# Patient Record
Sex: Male | Born: 1990 | Race: White | Hispanic: No | Marital: Married | Smoking: Current every day smoker
Health system: Southern US, Community
[De-identification: ages and names within clinical notes are randomized; demographics above are authoritative.]

## PROBLEM LIST (undated history)

## (undated) DIAGNOSIS — F988 Other specified behavioral and emotional disorders with onset usually occurring in childhood and adolescence: Secondary | ICD-10-CM

## (undated) DIAGNOSIS — F39 Unspecified mood [affective] disorder: Secondary | ICD-10-CM

## (undated) DIAGNOSIS — S6990XA Unspecified injury of unspecified wrist, hand and finger(s), initial encounter: Secondary | ICD-10-CM

## (undated) DIAGNOSIS — J301 Allergic rhinitis due to pollen: Secondary | ICD-10-CM

## (undated) DIAGNOSIS — R198 Other specified symptoms and signs involving the digestive system and abdomen: Secondary | ICD-10-CM

## (undated) DIAGNOSIS — G4700 Insomnia, unspecified: Secondary | ICD-10-CM

## (undated) DIAGNOSIS — F329 Major depressive disorder, single episode, unspecified: Secondary | ICD-10-CM

## (undated) DIAGNOSIS — S82409A Unspecified fracture of shaft of unspecified fibula, initial encounter for closed fracture: Secondary | ICD-10-CM

## (undated) DIAGNOSIS — F419 Anxiety disorder, unspecified: Secondary | ICD-10-CM

## (undated) DIAGNOSIS — F909 Attention-deficit hyperactivity disorder, unspecified type: Secondary | ICD-10-CM

## (undated) DIAGNOSIS — R030 Elevated blood-pressure reading, without diagnosis of hypertension: Secondary | ICD-10-CM

## (undated) DIAGNOSIS — F32A Depression, unspecified: Secondary | ICD-10-CM

## (undated) DIAGNOSIS — G43909 Migraine, unspecified, not intractable, without status migrainosus: Secondary | ICD-10-CM

## (undated) HISTORY — DX: Depression, unspecified: F32.A

## (undated) HISTORY — DX: Major depressive disorder, single episode, unspecified: F32.9

## (undated) HISTORY — DX: Elevated blood-pressure reading, without diagnosis of hypertension: R03.0

## (undated) HISTORY — DX: Unspecified mood (affective) disorder: F39

## (undated) HISTORY — DX: Migraine, unspecified, not intractable, without status migrainosus: G43.909

## (undated) HISTORY — DX: Other specified behavioral and emotional disorders with onset usually occurring in childhood and adolescence: F98.8

## (undated) HISTORY — DX: Anxiety disorder, unspecified: F41.9

## (undated) HISTORY — PX: INGUINAL HERNIA REPAIR: SHX194

## (undated) HISTORY — DX: Allergic rhinitis due to pollen: J30.1

## (undated) HISTORY — DX: Insomnia, unspecified: G47.00

## (undated) HISTORY — DX: Unspecified injury of unspecified wrist, hand and finger(s), initial encounter: S69.90XA

## (undated) HISTORY — DX: Attention-deficit hyperactivity disorder, unspecified type: F90.9

---
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
## 2011-05-15 ENCOUNTER — Emergency Department (HOSPITAL_BASED_OUTPATIENT_CLINIC_OR_DEPARTMENT_OTHER)
Admission: EM | Admit: 2011-05-15 | Discharge: 2011-05-15 | Disposition: A | Discharge status: Home / Self Care | Payer: Self-pay | Attending: Emergency Medicine | Admitting: Emergency Medicine

## 2011-05-15 ENCOUNTER — Encounter (HOSPITAL_BASED_OUTPATIENT_CLINIC_OR_DEPARTMENT_OTHER): Payer: Self-pay | Admitting: Emergency Medicine

## 2011-05-15 DIAGNOSIS — F17200 Nicotine dependence, unspecified, uncomplicated: Secondary | ICD-10-CM | POA: Insufficient documentation

## 2011-05-15 DIAGNOSIS — B372 Candidiasis of skin and nail: Secondary | ICD-10-CM | POA: Insufficient documentation

## 2011-05-15 MED ORDER — CLOTRIMAZOLE 1 % EX CREA: TOPICAL_CREAM | CUTANEOUS | Status: AC

## 2011-05-15 NOTE — ED Provider Notes (Signed)
History     CSN: [id number]  Arrival date & time 05/15/11  1704   First MD Initiated Contact with Patient 05/15/11 1706      Chief Complaint  Patient presents with  . Penile Discharge    HPI Patient presents with concerns of lesions on his penis.  He notes that his symptoms began approximately one month ago.  Since that time there have been persistent skin lesions at the proximal edge of the glans.  He denies any new dysuria, polyuria, fevers, chills, nausea, vomiting, other focal complaints.  The patient is married and monogamous.  He uses condoms for intercourse.  He denies any history of STDs.   History reviewed. No pertinent past medical history.  History reviewed. No pertinent past surgical history.  No family history on file.  History  Substance Use Topics  . Smoking status: Current Everyday Smoker -- 1.0 packs/day  . Smokeless tobacco: Not on file  . Alcohol Use: No      Review of Systems  All other systems reviewed and are negative.    Allergies  Ceclor  Home Medications   Current Outpatient Rx  Name Route Sig Dispense Refill  . CLOTRIMAZOLE 1 % EX CREA  Apply to affected area 2 times daily for two weeks. 15 g 0    BP 150/71  Pulse 92  Temp(Src) 98.1 F (36.7 C) (Oral)  Resp 16  Ht 5\' 10"  (1.778 m)  Wt 189 lb (85.73 kg)  BMI 27.12 kg/m2  SpO2 100%  Physical Exam  Nursing note and vitals reviewed. Constitutional: He appears well-developed and well-nourished. No distress.  HENT:  Head: Normocephalic and atraumatic.  Eyes: Conjunctivae and EOM are normal.  Cardiovascular: Normal rate and regular rhythm.   Pulmonary/Chest: Effort normal and breath sounds normal.  Abdominal: Hernia confirmed negative in the right inguinal area and confirmed negative in the left inguinal area.  Genitourinary: Testes normal.    Right testis shows no mass and no swelling. Right testis is descended. Left testis shows no mass and no swelling. Left testis is descended.  Circumcised. No phimosis, hypospadias, penile erythema or penile tenderness. No discharge found.  Lymphadenopathy:       Right: No inguinal adenopathy present.       Left: No inguinal adenopathy present.  Skin: He is not diaphoretic.    ED Course  Procedures (including critical care time)  Labs Reviewed - No data to display No results found.   1. Yeast infection of the skin       MDM  This generally well-appearing young male, who is married, monogamous, with no history of STDs now presents with approximately one month of penis sores.  On exam the patient is in no distress, with unremarkable vital signs.  The patient has cutaneous lesions consistent with yeast infection.  I discussed this at length with the patient and his wife.  The patient was discharged with instructions for topical medication, PMD followup.    Gerhard Munch, MD 05/15/11 1734

## 2011-05-15 NOTE — ED Notes (Signed)
States he has a large sore that looks similar to the ulcers in your mouth on his penis.  Is not sure how long he has had it but thinks it's been almost a month.

## 2011-05-15 NOTE — Discharge Instructions (Signed)
Cutaneous Candidiasis  Cutaneous candidiasis is a condition in which there is an overgrowth of yeast (candida) on the skin. Yeast normally live on the skin, but in small enough numbers not to cause any symptoms. In certain cases, increased growth of the yeast may cause an actual yeast infection. This kind of infection usually occurs in areas of the skin that are constantly warm and moist, such as the armpits or the groin. Yeast is the most common cause of diaper rash in babies and in people who cannot control their bowel movements (incontinence).  CAUSES   The fungus that most often causes cutaneous candidiasis is Candida albicans. Conditions that can increase the risk of getting a yeast infection of the skin include:   Obesity.   Pregnancy.   Diabetes.   Taking antibiotic medicine.   Taking birth control pills.   Taking steroid medicines.   Thyroid disease.   An iron or zinc deficiency.   Problems with the immune system.  SYMPTOMS    Red, swollen area of the skin.   Bumps on the skin.   Itchiness.  DIAGNOSIS   The diagnosis of cutaneous candidiasis is usually based on its appearance. Light scrapings of the skin may also be taken and viewed under a microscope to identify the presence of yeast.  TREATMENT   Antifungal creams may be applied to the infected skin. In severe cases, oral medicines may be needed.   HOME CARE INSTRUCTIONS    Keep your skin clean and dry.   Maintain a healthy weight.   If you have diabetes, keep your blood sugar under control.  SEEK IMMEDIATE MEDICAL CARE IF:   Your rash continues to spread despite treatment.   You have a fever, chills, or abdominal pain.  Document Released: 09/17/2010 Document Revised: 12/19/2010 Document Reviewed: 09/17/2010  [employer] Patient Information 2012 [employer].

## 2012-03-07 ENCOUNTER — Emergency Department (HOSPITAL_BASED_OUTPATIENT_CLINIC_OR_DEPARTMENT_OTHER)
Admission: EM | Admit: 2012-03-07 | Discharge: 2012-03-08 | Disposition: A | Discharge status: Home / Self Care | Payer: PRIVATE HEALTH INSURANCE | Attending: Emergency Medicine | Admitting: Emergency Medicine

## 2012-03-07 ENCOUNTER — Encounter (HOSPITAL_BASED_OUTPATIENT_CLINIC_OR_DEPARTMENT_OTHER): Payer: Self-pay | Admitting: *Deleted

## 2012-03-07 DIAGNOSIS — K6289 Other specified diseases of anus and rectum: Secondary | ICD-10-CM | POA: Insufficient documentation

## 2012-03-07 DIAGNOSIS — L539 Erythematous condition, unspecified: Secondary | ICD-10-CM | POA: Insufficient documentation

## 2012-03-07 DIAGNOSIS — L0291 Cutaneous abscess, unspecified: Secondary | ICD-10-CM

## 2012-03-07 DIAGNOSIS — L0231 Cutaneous abscess of buttock: Secondary | ICD-10-CM | POA: Insufficient documentation

## 2012-03-07 DIAGNOSIS — F17200 Nicotine dependence, unspecified, uncomplicated: Secondary | ICD-10-CM | POA: Insufficient documentation

## 2012-03-07 MED ORDER — OXYCODONE-ACETAMINOPHEN 5-325 MG PO TABS: 2.0000 | ORAL_TABLET | ORAL | Status: DC | PRN

## 2012-03-07 MED ORDER — ONDANSETRON 8 MG PO TBDP
8.0000 mg | ORAL_TABLET | Freq: Once | ORAL | Status: AC
  Administered 2012-03-07: 8 mg via ORAL
  Filled 2012-03-07: qty 1

## 2012-03-07 MED ORDER — MORPHINE SULFATE 4 MG/ML IJ SOLN
4.0000 mg | Freq: Once | INTRAMUSCULAR | Status: AC
  Administered 2012-03-07: 4 mg via INTRAMUSCULAR
  Filled 2012-03-07: qty 1

## 2012-03-07 MED ORDER — OXYCODONE-ACETAMINOPHEN 5-325 MG PO TABS
2.0000 | ORAL_TABLET | Freq: Once | ORAL | Status: AC
  Administered 2012-03-07: 2 via ORAL
  Filled 2012-03-07 (×2): qty 2

## 2012-03-07 MED ORDER — SULFAMETHOXAZOLE-TRIMETHOPRIM 800-160 MG PO TABS: 1.0000 | ORAL_TABLET | Freq: Two times a day (BID) | ORAL | Status: AC

## 2012-03-07 NOTE — ED Provider Notes (Signed)
History     CSN: [id number]  Arrival date & time 03/07/12  2130   First MD Initiated Contact with Patient 03/07/12 2218      Chief Complaint  Patient presents with  . boil to rectal area     (Consider location/radiation/quality/duration/timing/severity/associated sxs/prior treatment) HPI Comments: Patient is a 22 year old male who presents with a 1 week history of a "boil" near his anal area. Patient reports a gradual onset and progressive worsening of symptoms. Patient reports associated throbbing, severe pain that does not radiate. Patient also reports swelling and redness to the localized area. Palpation of the area makes the pain worse. No alleviating factors. Patient has not tried anything for symptoms. Patient denies fever, open wound, NVD, drainage from the affected area.    History reviewed. No pertinent past medical history.  History reviewed. No pertinent past surgical history.  No family history on file.  History  Substance Use Topics  . Smoking status: Current Every Day Smoker -- 1.00 packs/day  . Smokeless tobacco: Not on file  . Alcohol Use: No      Review of Systems  Skin: Positive for wound.  All other systems reviewed and are negative.    Allergies  Ceclor  Home Medications   Current Outpatient Rx  Name  Route  Sig  Dispense  Refill  . clotrimazole (LOTRIMIN) 1 % cream      Apply to affected area 2 times daily for two weeks.   15 g   0   . Neomycin-Bacitracin-Polymyxin (NEOSPORIN EX)   Apply externally   Apply 1 application topically daily. Patient used this medication for an injury to hand.           BP 123/72  Pulse 117  Temp(Src) 99.2 F (37.3 C) (Oral)  Resp 18  Ht 5\' 11"  (1.803 m)  Wt 200 lb 8 oz (90.946 kg)  BMI 27.98 kg/m2  SpO2 98%  Physical Exam  Nursing note and vitals reviewed. Constitutional: He is oriented to person, place, and time. He appears well-developed and well-nourished. No distress.  HENT:  Head:  Normocephalic and atraumatic.  Eyes: Conjunctivae and EOM are normal.  Neck: Normal range of motion. Neck supple.  Cardiovascular: Normal rate and regular rhythm.  Exam reveals no gallop and no friction rub.   No murmur heard. Pulmonary/Chest: Effort normal and breath sounds normal. He has no wheezes. He has no rales. He exhibits no tenderness.  Abdominal: Soft. He exhibits no distension. There is no tenderness. There is no rebound.  Genitourinary:  6cmx4cm area of induration with central fluctuance of left buttock. Affected area is erythematous.   Musculoskeletal: Normal range of motion.  Neurological: He is alert and oriented to person, place, and time. Coordination normal.  Speech is goal-oriented. Moves limbs without ataxia.   Skin: Skin is warm and dry.  See genitourinary.   Psychiatric: He has a normal mood and affect. His behavior is normal.    ED Course  Procedures (including critical care time)  INCISION AND DRAINAGE Performed by: Emilia Beck Consent: Verbal consent obtained. Risks and benefits: risks, benefits and alternatives were discussed Type: abscess  Body area: left buttock  Anesthesia: local infiltration  Incision was made with a scalpel.  Local anesthetic: lidocaine 2% with epinephrine  Anesthetic total: 2 ml  Complexity: complex Blunt dissection to break up loculations  Drainage: purulent  Drainage amount: 20 mL  Packing material: none  Patient tolerance: Patient tolerated the procedure well with no immediate complications.  Labs Reviewed - No data to display No results found.   1. Abscess and cellulitis       MDM  11:33 PM Abscess drained without complication. Patient will be discharged with Bactrim and pain medication. Patient instructed to return with worsening or concerning symptoms.         Emilia Beck, [place] 03/15/12 2041

## 2012-03-07 NOTE — ED Notes (Signed)
MD at bedside. 

## 2012-03-07 NOTE — ED Notes (Signed)
C/o "boil" near anal area that started one week ago. Denies hx of same. Denies fever. Denies any drainage from wound.

## 2012-03-07 NOTE — ED Notes (Signed)
Kaitlyn, PAC at bedside.  

## 2012-03-16 NOTE — ED Provider Notes (Signed)
History/physical exam/procedure(s) were performed by non-physician practitioner and as supervising physician I was immediately available for consultation/collaboration. I have reviewed all notes and am in agreement with care and plan. I saw and evaluated this 22 y.o. Male with and abscess to the left buttock.  The area has been tender and increasingly painful for several days.   Hilario Quarry, MD 03/16/12 [phone]

## 2012-06-06 ENCOUNTER — Encounter (HOSPITAL_BASED_OUTPATIENT_CLINIC_OR_DEPARTMENT_OTHER): Payer: Self-pay

## 2012-06-06 ENCOUNTER — Emergency Department (HOSPITAL_BASED_OUTPATIENT_CLINIC_OR_DEPARTMENT_OTHER)
Admission: EM | Admit: 2012-06-06 | Discharge: 2012-06-07 | Disposition: A | Discharge status: Home / Self Care | Payer: Self-pay | Attending: Emergency Medicine | Admitting: Emergency Medicine

## 2012-06-06 DIAGNOSIS — Y9389 Activity, other specified: Secondary | ICD-10-CM | POA: Insufficient documentation

## 2012-06-06 DIAGNOSIS — Y929 Unspecified place or not applicable: Secondary | ICD-10-CM | POA: Insufficient documentation

## 2012-06-06 DIAGNOSIS — Z9189 Other specified personal risk factors, not elsewhere classified: Secondary | ICD-10-CM

## 2012-06-06 DIAGNOSIS — Z23 Encounter for immunization: Secondary | ICD-10-CM | POA: Insufficient documentation

## 2012-06-06 DIAGNOSIS — M795 Residual foreign body in soft tissue: Secondary | ICD-10-CM | POA: Insufficient documentation

## 2012-06-06 DIAGNOSIS — F17200 Nicotine dependence, unspecified, uncomplicated: Secondary | ICD-10-CM | POA: Insufficient documentation

## 2012-06-06 NOTE — ED Notes (Signed)
Patient here for evaluation of tick removal. Tick was embedded in right leg and he thinks part of same still there. Reports that he doesn't feel right

## 2012-06-07 MED ORDER — DOXYCYCLINE HYCLATE 100 MG PO CAPS: 100.0000 mg | ORAL_CAPSULE | Freq: Two times a day (BID) | ORAL | Status: DC

## 2012-06-07 MED ORDER — IBUPROFEN 200 MG PO TABS
ORAL_TABLET | ORAL | Status: AC
  Filled 2012-06-07: qty 4

## 2012-06-07 MED ORDER — IBUPROFEN 800 MG PO TABS
800.0000 mg | ORAL_TABLET | Freq: Once | ORAL | Status: AC
  Administered 2012-06-07: 800 mg via ORAL
  Filled 2012-06-07: qty 1

## 2012-06-07 MED ORDER — IBUPROFEN 600 MG PO TABS: 600.0000 mg | ORAL_TABLET | Freq: Four times a day (QID) | ORAL | Status: DC | PRN

## 2012-06-07 MED ORDER — TETANUS-DIPHTH-ACELL PERTUSSIS 5-2.5-18.5 LF-MCG/0.5 IM SUSP
0.5000 mL | Freq: Once | INTRAMUSCULAR | Status: AC
  Administered 2012-06-07: 0.5 mL via INTRAMUSCULAR
  Filled 2012-06-07: qty 0.5

## 2012-06-07 MED ORDER — DOXYCYCLINE HYCLATE 100 MG PO TABS
100.0000 mg | ORAL_TABLET | Freq: Once | ORAL | Status: AC
  Administered 2012-06-07: 100 mg via ORAL
  Filled 2012-06-07: qty 1

## 2012-06-07 NOTE — ED Notes (Signed)
D/c home with rx x 2 for doxycycline and ibuprofen

## 2012-06-07 NOTE — ED Provider Notes (Signed)
History  This chart was scribed for Devin Frick Smitty Cords, MD by Devin Zimmerman, ED Scribe. This patient was seen in room MH07/MH07 and the patient's care was started at 12:25 AM.  CSN: [id number]  Arrival date & time 06/06/12  2211    Chief Complaint  Patient presents with  . Tick Removal    Patient is a 22 y.o. male presenting with rash. The history is provided by the patient. No language interpreter was used.  Rash Pain location: right lateral thigh. Pain severity:  No pain Context: not sick contacts   Context comment:  2 pimples and found a tick embedded in the more anterior one Relieved by:  Nothing Worsened by:  Nothing tried Ineffective treatments:  None tried Associated symptoms: no chest pain, no cough, no diarrhea, no dysuria, no fever, no nausea, no shortness of breath, no sore throat and no vomiting   Risk factors: no recent hospitalization     HPI Comments: Devin Zimmerman is a 22 y.o. male who presents to the Emergency Department for evaluation of tick removal. Pt states the tick was embedded in his upper, right leg and he thinks some of it is still in there. Pt thinks the tick was on his leg all night. Pt denies fever, neck pain, sore throat, visual disturbance, CP, cough, SOB, abdominal pain, nausea, emesis, diarrhea, urinary symptoms, back pain, HA, weakness, numbness and rash as associated symptoms.    History reviewed. No pertinent past medical history.  History reviewed. No pertinent past surgical history.  No family history on file.  History  Substance Use Topics  . Smoking status: Current Every Day Smoker -- 1.00 packs/day  . Smokeless tobacco: Not on file  . Alcohol Use: No      Review of Systems  Constitutional: Negative for fever.  HENT: Negative for sore throat, neck pain and neck stiffness.   Eyes: Negative for photophobia and visual disturbance.  Respiratory: Negative for cough and shortness of breath.   Cardiovascular: Negative for chest  pain.  Gastrointestinal: Negative for nausea, vomiting, abdominal pain and diarrhea.  Genitourinary: Negative for dysuria and difficulty urinating.  Musculoskeletal: Negative for back pain.  Skin: Negative for rash.  Neurological: Negative for weakness and headaches.  All other systems reviewed and are negative.    Allergies  Ceclor  Home Medications  No current outpatient prescriptions on file.  BP 145/71  Pulse 77  Temp(Src) 98.8 F (37.1 C) (Oral)  Resp 20  SpO2 100%  Physical Exam  Nursing note and vitals reviewed. Constitutional: He appears well-developed and well-nourished.  HENT:  Head: Normocephalic and atraumatic.  Mouth/Throat: Oropharynx is clear and moist.  Eyes: EOM are normal. Pupils are equal, round, and reactive to light.  Neck: Normal range of motion. Neck supple.  No meningial signs. No lymphanopothy.   Cardiovascular: Normal rate and regular rhythm.   Pulmonary/Chest: Effort normal and breath sounds normal.  Abdominal: Soft. Bowel sounds are normal. There is no tenderness.  Musculoskeletal: Normal range of motion.  No erythema. No ringlike.    Skin: Skin is warm and dry. No rash noted.  No bullseye rash. No rash on wrists or ankles. 2 pimples 6 mm right lateral proximal thigh    ED Course  FOREIGN BODY REMOVAL Date/Time: 06/07/2012 12:44 AM Performed by: Jasmine Awe Authorized by: Jasmine Awe Consent: Verbal consent obtained. Patient identity confirmed: arm band Intake: right lateral proximal thigh. Patient sedated: no Patient restrained: no Complexity: complex 1 objects recovered. Objects  recovered: pincer from tick Post-procedure assessment: foreign body removed Patient tolerance: Patient tolerated the procedure well with no immediate complications.   (including critical care time)  DIAGNOSTIC STUDIES: Oxygen Saturation is 100% on RA, normal by my interpretation.    COORDINATION OF CARE: 12:38 AM-Discussed  treatment plan with pt at bedside and pt agreed to plan.   Labs Reviewed - No data to display No results found.   No diagnosis found.    MDM  Will start doxycycline 3 week course as patient thinks it was only there for 24 hours but is not sure.  Follow up on Tuesday with your doctor for ongoing testing and evaluation.  Patient verbalizes understanding and agrees to followup    I personally performed the services described in this documentation, which was scribed in my presence. The recorded information has been reviewed and is accurate.    Jasmine Awe, MD 06/07/12 [phone]

## 2013-03-29 ENCOUNTER — Encounter: Payer: Self-pay | Admitting: Family Medicine

## 2013-03-29 ENCOUNTER — Ambulatory Visit (INDEPENDENT_AMBULATORY_CARE_PROVIDER_SITE_OTHER): Payer: BC Managed Care – PPO | Admitting: Family Medicine

## 2013-03-29 VITALS — BP 148/84 | HR 93 | Temp 99.1°F | Resp 18 | Ht 70.0 in | Wt 208.0 lb

## 2013-03-29 DIAGNOSIS — R112 Nausea with vomiting, unspecified: Secondary | ICD-10-CM

## 2013-03-29 DIAGNOSIS — J019 Acute sinusitis, unspecified: Secondary | ICD-10-CM

## 2013-03-29 DIAGNOSIS — J209 Acute bronchitis, unspecified: Secondary | ICD-10-CM

## 2013-03-29 MED ORDER — AZITHROMYCIN 250 MG PO TABS: ORAL_TABLET | ORAL | Status: DC

## 2013-03-29 MED ORDER — PREDNISONE 20 MG PO TABS: ORAL_TABLET | ORAL | Status: DC

## 2013-03-29 MED ORDER — HYDROCODONE-HOMATROPINE 5-1.5 MG/5ML PO SYRP: ORAL_SOLUTION | ORAL | Status: DC

## 2013-03-29 NOTE — Progress Notes (Signed)
Pre visit review using our clinic review tool, if applicable. No additional management support is needed unless otherwise documented below in the visit note. 

## 2013-03-29 NOTE — Progress Notes (Signed)
Office Note 03/29/2013  CC:  Chief Complaint  Patient presents with  . Emesis    this am  . Cough    x 1 week    HPI:  Devin Zimmerman is a 23 y.o. White male who is here to establish care and discuss acute issue: vomiting this morning. Patient's most recent primary MD: none Old records in EPIC/[facility] EMR were reviewed prior to or during today's visit.  Onset of nasal congest/runny nose, cough (worst symptom), subj f/c but Tm when temp checked has been 99. No wheezing or SOB.  Cough not worsened by anything, no improvement with OTC meds like robitussin. This morning he ate a biscuit and drank sweet tea and he vomited all of this up.  Still feeling nauseated currently. No abd pain.  No loose BMs.  No ST.  HA "sometimes".  History reviewed. No pertinent past medical history.  History reviewed. No pertinent past surgical history.  History reviewed. No pertinent family history.  History   Social History  . Marital Status: Married    Spouse Name: N/A    Number of Children: N/A  . Years of Education: N/A   Occupational History  . Not on file.   Social History Main Topics  . Smoking status: Former Smoker -- 1.00 packs/day    Types: Cigarettes  . Smokeless tobacco: Never Used  . Alcohol Use: No  . Drug Use: No  . Sexual Activity: Yes   Other Topics Concern  . Not on file   Social History Narrative   Married, 2 children (ages 633 and 1).   Occupation: Engineer, siteHVAC technician.   Education: finished 10th grade   No tobacco, occ alcohol, no hx of ETOH abuse or drug use.         MEDS: robitussin  Allergies  Allergen Reactions  . Ceclor [Cefaclor] Rash    ROS Review of Systems  Constitutional: Positive for fatigue. Negative for fever.  HENT: Negative for congestion and sore throat.   Eyes: Negative for visual disturbance.  Respiratory: Positive for cough.   Cardiovascular: Negative for chest pain.  Gastrointestinal: Negative for nausea and abdominal pain.   Genitourinary: Negative for dysuria.  Musculoskeletal: Negative for back pain and joint swelling.  Skin: Negative for rash.  Neurological: Negative for weakness and headaches.  Hematological: Negative for adenopathy.    PE; Blood pressure 148/84, pulse 93, temperature 99.1 F (37.3 C), temperature source Temporal, resp. rate 18, height 5\' 10"  (1.778 m), weight 208 lb (94.348 kg), SpO2 98.00%. VS: noted--normal. Gen: alert, NAD, tired but nontoxic-APPEARING. HEENT: eyes without injection, drainage, or swelling.  Ears: EACs clear, TMs with normal light reflex and landmarks.  Nose: Clear rhinorrhea, with some dried, crusty exudate adherent to mildly injected mucosa.  No purulent d/c.  No paranasal sinus TTP.  No facial swelling.  Throat and mouth without focal lesion.  No pharyngial swelling, erythema, or exudate.   Neck: supple, no LAD.   LUNGS: CTA bilat, nonlabored resps.   CV: RRR, no m/r/g. ABD: soft, NT/ND, BS slightly hypoactive.  No mass or HSM or bruit. EXT: no c/c/e SKIN: no rash  Pertinent labs:  none  ASSESSMENT AND PLAN:   New pt; no old records to obtain.  Prolonged URI, acute bronchitis. Nausea likely related to excessive mucous/PND. Plan: Z-pack, prednisone 40mg  qd x 5d, hycodan susp 1-2 tsp q6h prn, #240 ml, no RF. Signs/symptoms to call or return for were reviewed and pt expressed understanding.  An After Visit Summary  was printed and given to the patient.  F/u prn.

## 2015-01-22 ENCOUNTER — Encounter: Payer: Self-pay | Admitting: Family Medicine

## 2015-01-22 ENCOUNTER — Ambulatory Visit (INDEPENDENT_AMBULATORY_CARE_PROVIDER_SITE_OTHER): Payer: BLUE CROSS/BLUE SHIELD | Admitting: Family Medicine

## 2015-01-22 ENCOUNTER — Encounter: Payer: Self-pay | Admitting: *Deleted

## 2015-01-22 VITALS — BP 134/84 | HR 103 | Temp 98.2°F | Resp 16 | Ht 70.0 in | Wt 203.8 lb

## 2015-01-22 DIAGNOSIS — J069 Acute upper respiratory infection, unspecified: Secondary | ICD-10-CM | POA: Diagnosis not present

## 2015-01-22 DIAGNOSIS — B9789 Other viral agents as the cause of diseases classified elsewhere: Principal | ICD-10-CM

## 2015-01-22 MED ORDER — HYDROCODONE-HOMATROPINE 5-1.5 MG/5ML PO SYRP: ORAL_SOLUTION | ORAL | Status: DC

## 2015-01-22 NOTE — Patient Instructions (Signed)
Get otc generic robitussin DM OR Mucinex DM and use as directed on the packaging for cough and congestion. Use otc generic saline nasal spray 2-3 times per day to irrigate/moisturize your nasal passages.   

## 2015-01-22 NOTE — Progress Notes (Signed)
Pre visit review using our clinic review tool, if applicable. No additional management support is needed unless otherwise documented below in the visit note. 

## 2015-01-22 NOTE — Progress Notes (Signed)
OFFICE NOTE  01/22/2015  CC:  Chief Complaint  Patient presents with  . Cough    x 3 days    HPI: Patient is a 25 y.o. Caucasian male who is here for cough. Onset 4 days ago runny nose, cough, feels tight in chest when he coughs.  No ST or HA.  No fever. No body aches.  No OTC meds tried for symptoms except cough drops. He is not a smoker.  Has not had flu vaccine this season.  Pertinent PMH:  Past medical, surgical, social, and family history reviewed and no changes are noted since last office visit.  MEDS:  MVI,  PE: Blood pressure 134/84, pulse 103, temperature 98.2 F (36.8 C), temperature source Oral, resp. rate 16, height 5\' 10"  (1.778 m), weight 203 lb 12 oz (92.42 kg), SpO2 96 %. VS: noted--normal. Gen: alert, NAD, NONTOXIC APPEARING. HEENT: eyes without injection, drainage, or swelling.  Ears: EACs clear, TMs with normal light reflex and landmarks.  Nose: Clear rhinorrhea, with some dried, crusty exudate adherent to mildly injected mucosa.  No purulent d/c.  Minimal L>R paranasal sinus TTP.  No facial swelling.  Throat and mouth without focal lesion.  No pharyngial swelling, erythema, or exudate.   Neck: supple, no LAD.   LUNGS: CTA bilat, nonlabored resps.   CV: RRR, no m/r/g. EXT: no c/c/e SKIN: no rash   IMPRESSION AND PLAN:  Viral URI with cough. Get otc generic robitussin DM OR Mucinex DM and use as directed on the packaging for cough and congestion. Use otc generic saline nasal spray 2-3 times per day to irrigate/moisturize your nasal passages. Hycodan syrup, 1-2 tsp qhs prn, #12120ml.  Therapeutic expectations and side effect profile of medication discussed today.  Patient's questions answered.  Push fluids, rest. Work note for today.  An After Visit Summary was printed and given to the patient.  FOLLOW UP: prn

## 2015-04-11 ENCOUNTER — Ambulatory Visit (INDEPENDENT_AMBULATORY_CARE_PROVIDER_SITE_OTHER): Payer: BLUE CROSS/BLUE SHIELD | Admitting: Family Medicine

## 2015-04-11 ENCOUNTER — Encounter: Payer: Self-pay | Admitting: Family Medicine

## 2015-04-11 VITALS — BP 118/81 | HR 86 | Temp 98.8°F | Resp 16 | Ht 70.0 in | Wt 207.5 lb

## 2015-04-11 DIAGNOSIS — B9789 Other viral agents as the cause of diseases classified elsewhere: Principal | ICD-10-CM

## 2015-04-11 DIAGNOSIS — J069 Acute upper respiratory infection, unspecified: Secondary | ICD-10-CM

## 2015-04-11 MED ORDER — HYDROCODONE-HOMATROPINE 5-1.5 MG/5ML PO SYRP: 5.0000 mL | ORAL_SOLUTION | Freq: Three times a day (TID) | ORAL | Status: DC | PRN

## 2015-04-11 NOTE — Progress Notes (Signed)
OFFICE VISIT  04/11/2015   CC:  Chief Complaint  Patient presents with  . Cough    x 3 days  . Headache    x 3 days   HPI:    Patient is a 25 y.o. Caucasian male who presents for cough and headache. Onset 3-4 days ago, cough, sniffles, HA, mild chest tightness but no wheezing.  No fever. No pain in face or upper teeth.  No known sick contacts.  Fatigued but no achiness in body. Tylenol, aleve, ibup all tried an no help for HA.   No ST.  The cough is nonproductive for the most part. Tried robitussin--no help x 1 night.  No past medical history on file. No signif PMH  No past surgical history on file. No PSH  MEDS: no rx meds.  Allergies  Allergen Reactions  . Ceclor [Cefaclor] Rash    ROS As per HPI  PE: Blood pressure 118/81, pulse 86, temperature 98.8 F (37.1 C), temperature source Oral, resp. rate 16, height 5\' 10"  (1.778 m), weight 207 lb 8 oz (94.121 kg), SpO2 98 %. VS: noted--normal. Gen: alert, NAD, NONTOXIC APPEARING. HEENT: eyes without injection, drainage, or swelling.  Ears: EACs clear, TMs with normal light reflex and landmarks.  Nose: Clear rhinorrhea, with some dried, crusty exudate adherent to mildly injected mucosa.  No purulent d/c.  No paranasal sinus TTP.  No facial swelling.  Throat and mouth without focal lesion.  No pharyngial swelling, erythema, or exudate.   Neck: supple, no LAD.   LUNGS: CTA bilat, nonlabored resps.   CV: RRR, no m/r/g. EXT: no c/c/e SKIN: no rash  LABS:  none  IMPRESSION AND PLAN:  URI with cough.  NO sign of bacterial infection or RAD. Treat symptomatically with hycodan cough syrup, 1 tsp q6h prn, #26940ml. Push fluids.  Rest.  An After Visit Summary was printed and given to the patient.   FOLLOW UP: Return if symptoms worsen or fail to improve.  Signed:  Santiago BumpersPhil McGowen, MD           04/11/2015

## 2015-04-11 NOTE — Progress Notes (Signed)
Pre visit review using our clinic review tool, if applicable. No additional management support is needed unless otherwise documented below in the visit note. 

## 2015-05-21 ENCOUNTER — Ambulatory Visit (INDEPENDENT_AMBULATORY_CARE_PROVIDER_SITE_OTHER): Payer: BLUE CROSS/BLUE SHIELD | Admitting: Family Medicine

## 2015-05-21 ENCOUNTER — Ambulatory Visit (HOSPITAL_BASED_OUTPATIENT_CLINIC_OR_DEPARTMENT_OTHER)
Admission: RE | Admit: 2015-05-21 | Discharge: 2015-05-21 | Disposition: A | Discharge status: Home / Self Care | Payer: BLUE CROSS/BLUE SHIELD | Source: Ambulatory Visit | Attending: Family Medicine | Admitting: Family Medicine

## 2015-05-21 ENCOUNTER — Encounter: Payer: Self-pay | Admitting: Family Medicine

## 2015-05-21 VITALS — BP 130/84 | HR 75 | Temp 98.8°F | Resp 18 | Wt 206.8 lb

## 2015-05-21 DIAGNOSIS — S6991XA Unspecified injury of right wrist, hand and finger(s), initial encounter: Secondary | ICD-10-CM | POA: Insufficient documentation

## 2015-05-21 DIAGNOSIS — X58XXXA Exposure to other specified factors, initial encounter: Secondary | ICD-10-CM | POA: Insufficient documentation

## 2015-05-21 MED ORDER — NAPROXEN 500 MG PO TABS: 500.0000 mg | ORAL_TABLET | Freq: Two times a day (BID) | ORAL | Status: DC
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
## 2015-05-21 NOTE — Patient Instructions (Signed)
Xray placed for you to get tonight.  Take naproxen two x a day for 10 days (with food).  Wear your wrist splint during waking hours.  Avoid twisting wrist.  F/U 1 week  Wrist Pain There are many things that can cause wrist pain. Some common causes include:  An injury to the wrist area, such as a sprain, strain, or fracture.  Overuse of the joint.  A condition that causes increased pressure on a nerve in the wrist (carpal tunnel syndrome).  Wear and tear of the joints that occurs with aging (osteoarthritis).  A variety of other types of arthritis. Sometimes, the cause of wrist pain is not known. The pain often goes away when you follow your health care provider's instructions for relieving pain at home. If your wrist pain continues, tests may need to be done to diagnose your condition. HOME CARE INSTRUCTIONS Pay attention to any changes in your symptoms. Take these actions to help with your pain:  Rest the wrist area for at least 48 hours or as told by your health care provider.  If directed, apply ice to the injured area:  Put ice in a plastic bag.  Place a towel between your skin and the bag.  Leave the ice on for 20 minutes, 2-3 times per day.  Keep your arm raised (elevated) above the level of your heart while you are sitting or lying down.  If a splint or elastic bandage has been applied, use it as told by your health care provider.  Remove the splint or bandage only as told by your health care provider.  Loosen the splint or bandage if your fingers become numb or have a tingling feeling, or if they turn cold or blue.  Take over-the-counter and prescription medicines only as told by your health care provider.  Keep all follow-up visits as told by your health care provider. This is important. SEEK MEDICAL CARE IF:  Your pain is not helped by treatment.  Your pain gets worse. SEEK IMMEDIATE MEDICAL CARE IF:  Your fingers become swollen.  Your fingers turn white,  very red, or cold and blue.  Your fingers are numb or have a tingling feeling.  You have difficulty moving your fingers.   This information is not intended to replace advice given to you by your health care provider. Make sure you discuss any questions you have with your health care provider.   Document Released: 10/09/2004 Document Revised: 09/20/2014 Document Reviewed: 05/17/2014 Elsevier Interactive Patient Education [employer]2016 Elsevier Inc.

## 2015-05-22 ENCOUNTER — Telehealth: Payer: Self-pay | Admitting: Family Medicine

## 2015-05-22 NOTE — Telephone Encounter (Signed)
Please call pt: - his wrist xray is normal; no fracture. Still needs to follow up in 1 week to make certain improving and further imaging not needed.

## 2015-05-22 NOTE — Telephone Encounter (Signed)
Spoke with patient reviewed results and instructions . Patient will call back to schedule appt.

## 2015-08-31 ENCOUNTER — Ambulatory Visit (INDEPENDENT_AMBULATORY_CARE_PROVIDER_SITE_OTHER): Payer: BLUE CROSS/BLUE SHIELD | Admitting: Family Medicine

## 2015-08-31 ENCOUNTER — Encounter: Payer: Self-pay | Admitting: Family Medicine

## 2015-08-31 VITALS — BP 135/91 | HR 77 | Temp 98.2°F | Resp 16 | Ht 70.0 in | Wt 206.8 lb

## 2015-08-31 DIAGNOSIS — F39 Unspecified mood [affective] disorder: Secondary | ICD-10-CM | POA: Diagnosis not present

## 2015-08-31 DIAGNOSIS — F918 Other conduct disorders: Secondary | ICD-10-CM

## 2015-08-31 DIAGNOSIS — F411 Generalized anxiety disorder: Secondary | ICD-10-CM | POA: Diagnosis not present

## 2015-08-31 DIAGNOSIS — R454 Irritability and anger: Secondary | ICD-10-CM

## 2015-08-31 MED ORDER — DULOXETINE HCL 30 MG PO CPEP: 30.0000 mg | ORAL_CAPSULE | Freq: Every day | ORAL | 0 refills | Status: DC

## 2015-08-31 NOTE — Patient Instructions (Signed)
Call Behavioral health at [phone]. If no answer then call [phone].

## 2015-08-31 NOTE — Progress Notes (Signed)
Pre visit review using our clinic review tool, if applicable. No additional management support is needed unless otherwise documented below in the visit note. 
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
## 2015-09-17 ENCOUNTER — Encounter (HOSPITAL_BASED_OUTPATIENT_CLINIC_OR_DEPARTMENT_OTHER): Payer: Self-pay

## 2015-09-17 ENCOUNTER — Emergency Department (HOSPITAL_BASED_OUTPATIENT_CLINIC_OR_DEPARTMENT_OTHER)
Admission: EM | Admit: 2015-09-17 | Discharge: 2015-09-17 | Disposition: A | Discharge status: Home / Self Care | Payer: BLUE CROSS/BLUE SHIELD | Attending: Emergency Medicine | Admitting: Emergency Medicine

## 2015-09-17 DIAGNOSIS — Z87891 Personal history of nicotine dependence: Secondary | ICD-10-CM | POA: Diagnosis not present

## 2015-09-17 DIAGNOSIS — M5416 Radiculopathy, lumbar region: Secondary | ICD-10-CM | POA: Diagnosis not present

## 2015-09-17 DIAGNOSIS — M549 Dorsalgia, unspecified: Secondary | ICD-10-CM | POA: Diagnosis present

## 2015-09-17 MED ORDER — METHOCARBAMOL 500 MG PO TABS: 1000.0000 mg | ORAL_TABLET | Freq: Three times a day (TID) | ORAL | 0 refills | Status: DC | PRN

## 2015-09-17 MED ORDER — HYDROCODONE-ACETAMINOPHEN 5-325 MG PO TABS: 1.0000 | ORAL_TABLET | Freq: Four times a day (QID) | ORAL | 0 refills | Status: DC | PRN

## 2015-09-17 MED ORDER — IBUPROFEN 600 MG PO TABS: 600.0000 mg | ORAL_TABLET | Freq: Four times a day (QID) | ORAL | 0 refills | Status: DC | PRN

## 2015-09-17 MED ORDER — KETOROLAC TROMETHAMINE 60 MG/2ML IM SOLN
60.0000 mg | Freq: Once | INTRAMUSCULAR | Status: AC
  Administered 2015-09-17: 60 mg via INTRAMUSCULAR
  Filled 2015-09-17: qty 2

## 2015-09-17 MED ORDER — HYDROCODONE-ACETAMINOPHEN 5-325 MG PO TABS
1.0000 | ORAL_TABLET | Freq: Once | ORAL | Status: AC
  Administered 2015-09-17: 1 via ORAL
  Filled 2015-09-17: qty 1

## 2015-09-17 MED ORDER — METHOCARBAMOL 500 MG PO TABS
1000.0000 mg | ORAL_TABLET | Freq: Once | ORAL | Status: AC
  Administered 2015-09-17: 1000 mg via ORAL
  Filled 2015-09-17: qty 2
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
## 2015-09-21 ENCOUNTER — Ambulatory Visit: Payer: BLUE CROSS/BLUE SHIELD | Admitting: Family Medicine

## 2015-09-27 ENCOUNTER — Other Ambulatory Visit: Payer: Self-pay | Admitting: Family Medicine

## 2015-09-28 ENCOUNTER — Encounter: Payer: Self-pay | Admitting: Family Medicine

## 2015-09-28 ENCOUNTER — Ambulatory Visit (INDEPENDENT_AMBULATORY_CARE_PROVIDER_SITE_OTHER): Payer: BLUE CROSS/BLUE SHIELD | Admitting: Family Medicine

## 2015-09-28 VITALS — BP 141/89 | HR 84 | Temp 98.3°F | Resp 18 | Wt 211.8 lb

## 2015-09-28 DIAGNOSIS — R454 Irritability and anger: Secondary | ICD-10-CM | POA: Diagnosis not present

## 2015-09-28 DIAGNOSIS — S39012D Strain of muscle, fascia and tendon of lower back, subsequent encounter: Secondary | ICD-10-CM | POA: Diagnosis not present

## 2015-09-28 DIAGNOSIS — F329 Major depressive disorder, single episode, unspecified: Secondary | ICD-10-CM

## 2015-09-28 DIAGNOSIS — F32A Depression, unspecified: Secondary | ICD-10-CM

## 2015-09-28 MED ORDER — DULOXETINE HCL 60 MG PO CPEP: 60.0000 mg | ORAL_CAPSULE | Freq: Every day | ORAL | 1 refills | Status: DC

## 2015-09-28 NOTE — Progress Notes (Signed)
Pre visit review using our clinic review tool, if applicable. No additional management support is needed unless otherwise documented below in the visit note. 

## 2015-09-28 NOTE — Progress Notes (Signed)
OFFICE VISIT  09/28/2015   CC:  Chief Complaint  Patient presents with  . Follow-up    anxiety     HPI:    Patient is a 25 y.o. Caucasian male who presents for 1 mo f/u depression and anger control problems. I referred him to psychiatry at [facility] as well.  Feels more calm and less angry in latter part of the day.  Doesn't feel like he has much effect from med in 1st half of the day.  Wife notes that he varies his bedtime from night to night, seems like mood worse when he gets less sleep.  No side effects from the med. He was told he couldn't get into [facility] until 01/2016.   He went to the ED 09/17/15 for acute low back pain with radiculopathy sx's.  Exam did not support radiculopathy.  No trauma.  No x-rays.  He was rx'd vicodin 5/325, ibup 600mg  and robaxin.   States he still has some pain but has improved significantly.  No radiation down legs anymore.  No loss of bowel/bladder fxn, no saddle anesthesia.  No LE weakness.  ROM like bending and lifting at work exacerbate things.   History reviewed. No pertinent past medical history.  History reviewed. No pertinent surgical history.  Outpatient Medications Prior to Visit  Medication Sig Dispense Refill  . HYDROcodone-acetaminophen (NORCO) 5-325 MG tablet Take 1 tablet by mouth every 6 (six) hours as needed for severe pain. 6 tablet 0  . ibuprofen (ADVIL,MOTRIN) 600 MG tablet Take 1 tablet (600 mg total) by mouth every 6 (six) hours as needed. 30 tablet 0  . methocarbamol (ROBAXIN) 500 MG tablet Take 2 tablets (1,000 mg total) by mouth every 8 (eight) hours as needed for muscle spasms. 30 tablet 0  . DULoxetine (CYMBALTA) 30 MG capsule TAKE 1 CAPSULE BY MOUTH DAILY 30 capsule 0  . naproxen (NAPROSYN) 500 MG tablet Take 1 tablet (500 mg total) by mouth 2 (two) times daily with a meal. (Patient not taking: Reported on 09/28/2015) 30 tablet 0   No facility-administered medications prior to visit.     Allergies  Allergen Reactions  . Ceclor  [Cefaclor] Rash    ROS As per HPI  PE: Blood pressure (!) 141/89, pulse 84, temperature 98.3 F (36.8 C), temperature source Oral, resp. rate 18, weight 211 lb 12.8 oz (96.1 kg), SpO2 98 %.Marland Kitchengen1 Wt Readings from Last 2 Encounters:  09/28/15 211 lb 12.8 oz (96.1 kg)  09/17/15 205 lb (93 kg)    Gen: alert, oriented x 4, affect pleasant.  Lucid thinking and conversation noted. HEENT: PERRLA, EOMI.   Neck: no LAD, mass, or thyromegaly. CV: RRR, no m/r/g LUNGS: CTA bilat, nonlabored. NEURO: no tremor or tics noted on observation.  Coordination intact. CN 2-12 grossly intact bilaterally, strength 5/5 in all extremeties.  No ataxia. BACK: mild TTP in L lumbar region--upper levels more than lower. Sitting SLR neg bilat.  Trace patellar DTRs bilat.  LE strength 5/5 prox/dist bilat.  LABS:  none  IMPRESSION AND PLAN:  1) Depression, with anger control problems.   Slight response to 30 mg cymbalta x 62mo. Increase to 60 mg qd today, also made referral to private psychiatrist at pt's request (Dr. Ann Maki McKinney's office) b/c pt doesn't want to wait until 01/2016 to see psych at [facility].  2) Acute low back strain: referral to [facility] PT made today. He can continue the meds he was rx'd by the ED recently.  No new rx's  given today.  An After Visit Summary was printed and given to the patient.  FOLLOW UP: Return in about 4 weeks (around 10/26/2015) for f/u mood d/o and back (30 min).  Signed:  Santiago BumpersPhil McGowen, MD           09/28/2015

## 2015-10-14 DIAGNOSIS — S82409A Unspecified fracture of shaft of unspecified fibula, initial encounter for closed fracture: Secondary | ICD-10-CM

## 2015-10-14 HISTORY — DX: Unspecified fracture of shaft of unspecified fibula, initial encounter for closed fracture: S82.409A

## 2015-10-26 ENCOUNTER — Encounter: Payer: Self-pay | Admitting: Family Medicine

## 2015-10-26 ENCOUNTER — Ambulatory Visit (INDEPENDENT_AMBULATORY_CARE_PROVIDER_SITE_OTHER): Payer: BLUE CROSS/BLUE SHIELD | Admitting: Family Medicine

## 2015-10-26 VITALS — BP 126/80 | HR 97 | Temp 98.0°F | Resp 16 | Wt 215.1 lb

## 2015-10-26 DIAGNOSIS — F5105 Insomnia due to other mental disorder: Secondary | ICD-10-CM

## 2015-10-26 DIAGNOSIS — F418 Other specified anxiety disorders: Secondary | ICD-10-CM | POA: Diagnosis not present

## 2015-10-26 DIAGNOSIS — F419 Anxiety disorder, unspecified: Principal | ICD-10-CM

## 2015-10-26 DIAGNOSIS — F409 Phobic anxiety disorder, unspecified: Secondary | ICD-10-CM

## 2015-10-26 DIAGNOSIS — F329 Major depressive disorder, single episode, unspecified: Secondary | ICD-10-CM

## 2015-10-26 MED ORDER — BUSPIRONE HCL 15 MG PO TABS: 15.0000 mg | ORAL_TABLET | Freq: Three times a day (TID) | ORAL | 2 refills | Status: DC

## 2015-10-26 MED ORDER — DULOXETINE HCL 60 MG PO CPEP: 60.0000 mg | ORAL_CAPSULE | Freq: Every day | ORAL | 6 refills | Status: DC

## 2015-10-26 NOTE — Progress Notes (Signed)
OFFICE VISIT  10/26/2015   CC:  Chief Complaint  Patient presents with  . Follow-up    depression   HPI:    Patient is a 25 y.o. Caucasian male who presents for 1 mo f/u depression and anger control problems. Increased duloxetine to 60mg  qd last visit.  Also referred pt to Dr. Tish MenParrish McKinney's office (psychiatrist). Feels more relaxed, less uptight.  Still with anger, esp related to work.  Feels overworked/stressed.  Has lots of probs with initiating and maintaining sleep.  Home life is fine. Still with some depressed mood and occ feeling of being overwhelmed with anxiety. He cannot afford to go to Dr. Loralie ChampagneMcKinney's office at this time.  He feels like the duloxetine is helping but "there is still some stuff built up".  History reviewed. No pertinent past medical history.  History reviewed. No pertinent surgical history.  Outpatient Medications Prior to Visit  Medication Sig Dispense Refill  . ibuprofen (ADVIL,MOTRIN) 600 MG tablet Take 1 tablet (600 mg total) by mouth every 6 (six) hours as needed. 30 tablet 0  . methocarbamol (ROBAXIN) 500 MG tablet Take 2 tablets (1,000 mg total) by mouth every 8 (eight) hours as needed for muscle spasms. 30 tablet 0  . naproxen (NAPROSYN) 500 MG tablet Take 1 tablet (500 mg total) by mouth 2 (two) times daily with a meal. 30 tablet 0  . DULoxetine (CYMBALTA) 60 MG capsule Take 1 capsule (60 mg total) by mouth daily. 30 capsule 1  . HYDROcodone-acetaminophen (NORCO) 5-325 MG tablet Take 1 tablet by mouth every 6 (six) hours as needed for severe pain. (Patient not taking: Reported on 10/26/2015) 6 tablet 0   No facility-administered medications prior to visit.     Allergies  Allergen Reactions  . Ceclor [Cefaclor] Rash    ROS As per HPI  PE: Blood pressure 126/80, pulse 97, temperature 98 F (36.7 C), temperature source Oral, resp. rate 16, weight 215 lb 1.9 oz (97.6 kg), SpO2 97 %. Wt Readings from Last 2 Encounters:  10/26/15 215 lb  1.9 oz (97.6 kg)  09/28/15 211 lb 12.8 oz (96.1 kg)    Gen: alert, oriented x 4, affect pleasant.  Lucid thinking and conversation noted. HEENT: PERRLA, EOMI.   Neck: no LAD, mass, or thyromegaly. CV: RRR, no m/r/g LUNGS: CTA bilat, nonlabored. NEURO: no tremor or tics noted on observation.  Coordination intact. CN 2-12 grossly intact bilaterally, strength 5/5 in all extremeties.  No ataxia.  LABS:  none  IMPRESSION AND PLAN:  Depression, with anger control problems and generalized anxiety. Some of this is work-related and he needs to start working on some stress-relief activities like exercise and hobbies. Will continue 60 mg duloxetine daily and will add buspirone 15mg  tid. He is definitely much improved but I think we can get him feeling better from an anxiety standpoint and sleep standpoint. For now, he is not going to pursue psychiatrist b/c he cannot afford private psych, and [facility] could not see him until 01/2016.  An After Visit Summary was printed and given to the patient.  FOLLOW UP: Return in about 6 weeks (around 12/07/2015) for f/u mood/anxiety.  Signed:  Santiago BumpersPhil McGowen, MD           10/26/2015

## 2015-10-26 NOTE — Progress Notes (Signed)
Pre visit review using our clinic review tool, if applicable. No additional management support is needed unless otherwise documented below in the visit note. 

## 2015-11-05 ENCOUNTER — Telehealth: Payer: Self-pay

## 2015-11-05 NOTE — Telephone Encounter (Signed)
Patient called stating that he went to ER in [place]  Patient has fracture to ankle.  He is inquiring if he can take his Cymbalta and Buspirone with Hydrocodone.  Dr. Milinda CaveMcGowen informed and asked about medication and patient was informed that he could take medication with Hydrocodone. Patient verbalized understanding.

## 2015-11-06 ENCOUNTER — Encounter (HOSPITAL_BASED_OUTPATIENT_CLINIC_OR_DEPARTMENT_OTHER): Payer: Self-pay | Admitting: *Deleted

## 2015-11-07 NOTE — H&P (Signed)
ORTHOPAEDIC HISTORY & PHYSICAL  Devin LundMicheal J Zimmerman MRN:  [id number] DOB/SEX:  11/15/1990/male  CHIEF COMPLAINT:  Painful left ankle  HISTORY: Patient is a 25 y.o. male presented with a history of pain in the left ankle for 4 days. Onset of symptoms was abrupt starting 4 days ago with unchanged course since that time. Prior procedures on the ankle are none. Patient has been treated conservatively with over-the-counter NSAIDs and activity modification. Patient currently rates pain at 7 out of 10 with activity. There is pain at night. present.  They have been previously treated with: NSAIDS: NSAID with no improvement    PAST MEDICAL HISTORY: Patient Active Problem List   Diagnosis Date Noted  . Right wrist injury 05/21/2015   Past Medical History:  Diagnosis Date  . Difficulty swallowing pills   . Fibula fracture 10/2015   left   History reviewed. No pertinent surgical history.   MEDICATIONS:   No prescriptions prior to admission.    ALLERGIES:   Allergies  Allergen Reactions  . Ceclor [Cefaclor] Other (See Comments)    UNKNOWN    REVIEW OF SYSTEMS:  Pertinent items are noted in HPI.   FAMILY HISTORY:  History reviewed. No pertinent family history.  SOCIAL HISTORY:   Social History  Substance Use Topics  . Smoking status: Current Every Day Smoker    Packs/day: 0.50    Years: 5.00    Types: Cigarettes  . Smokeless tobacco: Never Used  . Alcohol use Yes     Comment: occasionally      EXAMINATION: Vital signs in last 24 hours:    Head is normocephalic.   Eyes:  Pupils equal, round and reactive to light and accommodation.  Extraocular intact. ENT: Ears, nose, and throat were benign.   Neck: supple, no bruits were noted.   Chest: good expansion.   Lungs: essentially clear.   Cardiac: regular rhythm and rate, normal S1, S2.  No murmurs appreciated. Pulses :  2+ bilateral and symmetric in lower extremities. Abdomen is scaphoid, soft, nontender, no masses  palpable, normal bowel sounds                  present. CNS:  He is oriented x3 and cranial nerves II-XII grossly intact. Breast, rectal, and genital exams: not performed and not indicated for an orthopedic evaluation. Musculoskeletal:   Ht 5\' 11"  (1.803 m)   Wt 90.7 kg (200 lb)   BMI 27.89 kg/m   General Appearance:    Alert, cooperative, no distress, appears stated age  Head:    Normocephalic, without obvious abnormality, atraumatic  Eyes:    PERRL, conjunctiva/corneas clear, EOM's intact, fundi    benign, both eyes       Ears:    Normal TM's and external ear canals, both ears  Nose:   Nares normal, septum midline, mucosa normal, no drainage    or sinus tenderness  Throat:   Lips, mucosa, and tongue normal; teeth and gums normal  Neck:   Supple, symmetrical, trachea midline, no adenopathy;       thyroid:  No enlargement/tenderness/nodules; no carotid   bruit or JVD  Back:     Symmetric, no curvature, ROM normal, no CVA tenderness  Lungs:     Clear to auscultation bilaterally, respirations unlabored  Chest wall:    No tenderness or deformity  Heart:    Regular rate and rhythm, S1 and S2 normal, no murmur, rub   or gallop  Abdomen:     Soft,  non-tender, bowel sounds active all four quadrants,    no masses, no organomegaly  Genitalia:    Normal male without lesion, discharge or tenderness  Rectal:    Normal tone, normal prostate, no masses or tenderness;   guaiac negative stool  Extremities:   Extremities normal, atraumatic, no cyanosis or edema  Pulses:   2+ and symmetric all extremities  Skin:   Skin color, texture, turgor normal, no rashes or lesions  Lymph nodes:   Cervical, supraclavicular, and axillary nodes normal  Neurologic:   CNII-XII intact. Normal strength, sensation and reflexes      throughout     Musculoskeletal:  ROMLeft ankle tender lateral and medial.  Moderate, but not extreme, swelling.  Closed injury.  Neurovascularly intact.   , LigamentsSyndesmosis intact      Imaging Review .  Displaced Weber B fracture.  Syndesmosis intact.  Fibula is displaced laterally with widening of the medial joint space.  Closed injury.   ASSESSMENT:  .Marland Kitchen  Vertical load external rotation injury, left ankle.  Displaced Weber B fracture.  Syndesmosis intact  Closed injury. Past Medical History:  Diagnosis Date  . Difficulty swallowing pills   . Fibula fracture 10/2015   left    PLAN: Plan for left ankle open reduction internal fixation of his lateral malleolus fracture with a plate.  Closed reduction of his deltoid ligament injury. We will do a popliteal block and then under general anesthesia.   Procedure, risks, benefits and complications reviewed.    The procedure,  risks, and benefits of total knee arthroplasty were presented and reviewed. The risks including but not limited to infection, blood clots, vascular and nerve injury, stiffness,  among others were discussed. The patient acknowledged the explanation, agreed to proceed.   BLAIR ROBERTS 11/07/2015, 7:07 PM

## 2015-11-08 ENCOUNTER — Encounter (HOSPITAL_BASED_OUTPATIENT_CLINIC_OR_DEPARTMENT_OTHER)
Admission: RE | Disposition: A | Discharge status: Home / Self Care | Payer: Self-pay | Source: Ambulatory Visit | Attending: Orthopedic Surgery

## 2015-11-08 ENCOUNTER — Ambulatory Visit (HOSPITAL_BASED_OUTPATIENT_CLINIC_OR_DEPARTMENT_OTHER): Payer: BLUE CROSS/BLUE SHIELD | Admitting: Anesthesiology

## 2015-11-08 ENCOUNTER — Ambulatory Visit (HOSPITAL_BASED_OUTPATIENT_CLINIC_OR_DEPARTMENT_OTHER)
Admission: RE | Admit: 2015-11-08 | Discharge: 2015-11-08 | Disposition: A | Discharge status: Home / Self Care | Payer: BLUE CROSS/BLUE SHIELD | Source: Ambulatory Visit | Attending: Orthopedic Surgery | Admitting: Orthopedic Surgery

## 2015-11-08 ENCOUNTER — Encounter (HOSPITAL_BASED_OUTPATIENT_CLINIC_OR_DEPARTMENT_OTHER): Payer: Self-pay

## 2015-11-08 DIAGNOSIS — S8262XA Displaced fracture of lateral malleolus of left fibula, initial encounter for closed fracture: Secondary | ICD-10-CM | POA: Insufficient documentation

## 2015-11-08 DIAGNOSIS — Z888 Allergy status to other drugs, medicaments and biological substances status: Secondary | ICD-10-CM | POA: Diagnosis not present

## 2015-11-08 DIAGNOSIS — F17210 Nicotine dependence, cigarettes, uncomplicated: Secondary | ICD-10-CM | POA: Diagnosis not present

## 2015-11-08 DIAGNOSIS — X58XXXA Exposure to other specified factors, initial encounter: Secondary | ICD-10-CM | POA: Insufficient documentation

## 2015-11-08 HISTORY — PX: ORIF FIBULA FRACTURE: SHX5114

## 2015-11-08 HISTORY — DX: Other specified symptoms and signs involving the digestive system and abdomen: R19.8

## 2015-11-08 HISTORY — DX: Unspecified fracture of shaft of unspecified fibula, initial encounter for closed fracture: S82.409A

## 2015-11-08 SURGERY — OPEN REDUCTION INTERNAL FIXATION (ORIF) FIBULA FRACTURE
Anesthesia: General | Site: Ankle | Laterality: Left

## 2015-11-08 MED ORDER — SCOPOLAMINE 1 MG/3DAYS TD PT72: 1.0000 | MEDICATED_PATCH | Freq: Once | TRANSDERMAL | Status: DC | PRN

## 2015-11-08 MED ORDER — CHLORHEXIDINE GLUCONATE 4 % EX LIQD: 60.0000 mL | Freq: Once | CUTANEOUS | Status: DC

## 2015-11-08 MED ORDER — FENTANYL CITRATE (PF) 100 MCG/2ML IJ SOLN
INTRAMUSCULAR | Status: AC
  Filled 2015-11-08: qty 2

## 2015-11-08 MED ORDER — BUPIVACAINE HCL (PF) 0.25 % IJ SOLN
INTRAMUSCULAR | Status: AC
  Filled 2015-11-08: qty 30

## 2015-11-08 MED ORDER — BUPIVACAINE HCL (PF) 0.5 % IJ SOLN
INTRAMUSCULAR | Status: AC
  Filled 2015-11-08: qty 60

## 2015-11-08 MED ORDER — CLINDAMYCIN PHOSPHATE 900 MG/50ML IV SOLN
900.0000 mg | INTRAVENOUS | Status: AC
  Administered 2015-11-08: 900 mg via INTRAVENOUS

## 2015-11-08 MED ORDER — CLINDAMYCIN PHOSPHATE 900 MG/50ML IV SOLN
INTRAVENOUS | Status: AC
  Filled 2015-11-08: qty 50

## 2015-11-08 MED ORDER — LIDOCAINE 2% (20 MG/ML) 5 ML SYRINGE
INTRAMUSCULAR | Status: DC | PRN
  Administered 2015-11-08: 20 mg via INTRAVENOUS

## 2015-11-08 MED ORDER — LACTATED RINGERS IV SOLN
INTRAVENOUS | Status: DC
  Administered 2015-11-08 (×2): via INTRAVENOUS

## 2015-11-08 MED ORDER — OXYCODONE HCL 5 MG PO TABS
ORAL_TABLET | ORAL | Status: AC
  Filled 2015-11-08: qty 1

## 2015-11-08 MED ORDER — DEXAMETHASONE SODIUM PHOSPHATE 10 MG/ML IJ SOLN
INTRAMUSCULAR | Status: AC
  Filled 2015-11-08: qty 1

## 2015-11-08 MED ORDER — FENTANYL CITRATE (PF) 100 MCG/2ML IJ SOLN
25.0000 ug | INTRAMUSCULAR | Status: DC | PRN
  Administered 2015-11-08: 50 ug via INTRAVENOUS

## 2015-11-08 MED ORDER — HYDROMORPHONE HCL 1 MG/ML IJ SOLN: 0.5000 mg | INTRAMUSCULAR | Status: DC | PRN

## 2015-11-08 MED ORDER — ROPIVACAINE HCL 5 MG/ML IJ SOLN
INTRAMUSCULAR | Status: DC | PRN
  Administered 2015-11-08: 30 mL via EPIDURAL

## 2015-11-08 MED ORDER — ONDANSETRON HCL 4 MG/2ML IJ SOLN
INTRAMUSCULAR | Status: DC | PRN
  Administered 2015-11-08: 4 mg via INTRAVENOUS

## 2015-11-08 MED ORDER — BUPIVACAINE HCL (PF) 0.25 % IJ SOLN
INTRAMUSCULAR | Status: AC
  Filled 2015-11-08: qty 60

## 2015-11-08 MED ORDER — GLYCOPYRROLATE 0.2 MG/ML IJ SOLN: 0.2000 mg | Freq: Once | INTRAMUSCULAR | Status: DC | PRN

## 2015-11-08 MED ORDER — METOCLOPRAMIDE HCL 5 MG/ML IJ SOLN: 5.0000 mg | Freq: Three times a day (TID) | INTRAMUSCULAR | Status: DC | PRN

## 2015-11-08 MED ORDER — ONDANSETRON HCL 4 MG PO TABS
4.0000 mg | ORAL_TABLET | Freq: Four times a day (QID) | ORAL | Status: DC | PRN
Start: 2015-11-08 — End: 2015-11-08

## 2015-11-08 MED ORDER — ONDANSETRON HCL 4 MG/2ML IJ SOLN
INTRAMUSCULAR | Status: AC
  Filled 2015-11-08: qty 2

## 2015-11-08 MED ORDER — PROPOFOL 10 MG/ML IV BOLUS
INTRAVENOUS | Status: AC
  Filled 2015-11-08: qty 20

## 2015-11-08 MED ORDER — ONDANSETRON HCL 4 MG/2ML IJ SOLN: 4.0000 mg | Freq: Once | INTRAMUSCULAR | Status: DC | PRN

## 2015-11-08 MED ORDER — ONDANSETRON HCL 4 MG/2ML IJ SOLN: 4.0000 mg | Freq: Four times a day (QID) | INTRAMUSCULAR | Status: DC | PRN

## 2015-11-08 MED ORDER — METOCLOPRAMIDE HCL 5 MG PO TABS: 5.0000 mg | ORAL_TABLET | Freq: Three times a day (TID) | ORAL | Status: DC | PRN

## 2015-11-08 MED ORDER — LIDOCAINE HCL (PF) 1 % IJ SOLN
INTRAMUSCULAR | Status: AC
  Filled 2015-11-08: qty 5

## 2015-11-08 MED ORDER — FENTANYL CITRATE (PF) 100 MCG/2ML IJ SOLN
50.0000 ug | INTRAMUSCULAR | Status: AC | PRN
  Administered 2015-11-08 (×2): 50 ug via INTRAVENOUS
  Administered 2015-11-08: 25 ug via INTRAVENOUS
  Administered 2015-11-08: 100 ug via INTRAVENOUS
  Administered 2015-11-08: 25 ug via INTRAVENOUS

## 2015-11-08 MED ORDER — SUGAMMADEX SODIUM 200 MG/2ML IV SOLN
INTRAVENOUS | Status: AC
  Filled 2015-11-08: qty 2

## 2015-11-08 MED ORDER — OXYCODONE-ACETAMINOPHEN 5-325 MG PO TABS: 1.0000 | ORAL_TABLET | ORAL | 0 refills | Status: DC | PRN

## 2015-11-08 MED ORDER — MIDAZOLAM HCL 2 MG/2ML IJ SOLN
1.0000 mg | INTRAMUSCULAR | Status: DC | PRN
  Administered 2015-11-08: 2 mg via INTRAVENOUS

## 2015-11-08 MED ORDER — LIDOCAINE 2% (20 MG/ML) 5 ML SYRINGE
INTRAMUSCULAR | Status: AC
  Filled 2015-11-08: qty 5

## 2015-11-08 MED ORDER — DEXAMETHASONE SODIUM PHOSPHATE 10 MG/ML IJ SOLN
INTRAMUSCULAR | Status: DC | PRN
  Administered 2015-11-08: 10 mg via INTRAVENOUS

## 2015-11-08 MED ORDER — PROPOFOL 10 MG/ML IV BOLUS
INTRAVENOUS | Status: DC | PRN
  Administered 2015-11-08: 200 mg via INTRAVENOUS
  Administered 2015-11-08: 100 mg via INTRAVENOUS
  Administered 2015-11-08: 30 mg via INTRAVENOUS

## 2015-11-08 MED ORDER — MIDAZOLAM HCL 2 MG/2ML IJ SOLN
INTRAMUSCULAR | Status: AC
  Filled 2015-11-08: qty 2

## 2015-11-08 MED ORDER — OXYCODONE HCL 5 MG PO TABS
5.0000 mg | ORAL_TABLET | ORAL | Status: DC | PRN
  Administered 2015-11-08: 5 mg via ORAL

## 2015-11-08 SURGICAL SUPPLY — 67 items
BANDAGE ACE 4X5 VEL STRL LF (GAUZE/BANDAGES/DRESSINGS) IMPLANT
BANDAGE ACE 6X5 VEL STRL LF (GAUZE/BANDAGES/DRESSINGS) ×3 IMPLANT
BANDAGE ESMARK 6X9 LF (GAUZE/BANDAGES/DRESSINGS) ×1 IMPLANT
BENZOIN TINCTURE PRP APPL 2/3 (GAUZE/BANDAGES/DRESSINGS) IMPLANT
BLADE SURG 15 STRL LF DISP TIS (BLADE) ×1 IMPLANT
BLADE SURG 15 STRL SS (BLADE) ×2
BNDG COHESIVE 4X5 TAN STRL (GAUZE/BANDAGES/DRESSINGS) ×3 IMPLANT
BNDG ESMARK 6X9 LF (GAUZE/BANDAGES/DRESSINGS) ×3
CANISTER SUCT 1200ML W/VALVE (MISCELLANEOUS) ×3 IMPLANT
CLOSURE WOUND 1/2 X4 (GAUZE/BANDAGES/DRESSINGS)
COVER BACK TABLE 60X90IN (DRAPES) ×3 IMPLANT
COVER MAYO STAND STRL (DRAPES) ×3 IMPLANT
CUFF TOURNIQUET SINGLE 34IN LL (TOURNIQUET CUFF) IMPLANT
DECANTER SPIKE VIAL GLASS SM (MISCELLANEOUS) IMPLANT
DRAPE EXTREMITY T 121X128X90 (DRAPE) ×3 IMPLANT
DRAPE IMP U-DRAPE 54X76 (DRAPES) ×3 IMPLANT
DRAPE OEC MINIVIEW 54X84 (DRAPES) ×3 IMPLANT
DRAPE U-SHAPE 47X51 STRL (DRAPES) ×3 IMPLANT
DRILL 2.6X122MM WL AO SHAFT (BIT) ×3 IMPLANT
DRSG PAD ABDOMINAL 8X10 ST (GAUZE/BANDAGES/DRESSINGS) ×3 IMPLANT
DURAPREP 26ML APPLICATOR (WOUND CARE) ×3 IMPLANT
ELECT REM PT RETURN 9FT ADLT (ELECTROSURGICAL) ×3
ELECTRODE REM PT RTRN 9FT ADLT (ELECTROSURGICAL) ×1 IMPLANT
GAUZE SPONGE 4X4 12PLY STRL (GAUZE/BANDAGES/DRESSINGS) ×3 IMPLANT
GAUZE XEROFORM 1X8 LF (GAUZE/BANDAGES/DRESSINGS) ×3 IMPLANT
GLOVE BIOGEL PI IND STRL 7.0 (GLOVE) ×1 IMPLANT
GLOVE BIOGEL PI IND STRL 8 (GLOVE) ×1 IMPLANT
GLOVE BIOGEL PI INDICATOR 7.0 (GLOVE) ×2
GLOVE BIOGEL PI INDICATOR 8 (GLOVE) ×2
GLOVE ECLIPSE 7.0 STRL STRAW (GLOVE) ×3 IMPLANT
GLOVE SURG ORTHO 8.0 STRL STRW (GLOVE) ×3 IMPLANT
GOWN STRL REUS W/ TWL LRG LVL3 (GOWN DISPOSABLE) ×1 IMPLANT
GOWN STRL REUS W/ TWL XL LVL3 (GOWN DISPOSABLE) ×2 IMPLANT
GOWN STRL REUS W/TWL LRG LVL3 (GOWN DISPOSABLE) ×2
GOWN STRL REUS W/TWL XL LVL3 (GOWN DISPOSABLE) ×7 IMPLANT
NEEDLE HYPO 25X1 1.5 SAFETY (NEEDLE) IMPLANT
NS IRRIG 1000ML POUR BTL (IV SOLUTION) ×3 IMPLANT
PACK BASIN DAY SURGERY FS (CUSTOM PROCEDURE TRAY) ×3 IMPLANT
PAD CAST 4YDX4 CTTN HI CHSV (CAST SUPPLIES) ×2 IMPLANT
PADDING CAST COTTON 4X4 STRL (CAST SUPPLIES) ×4
PENCIL BUTTON HOLSTER BLD 10FT (ELECTRODE) ×3 IMPLANT
PLATE FIBULA 4H (Plate) ×3 IMPLANT
SCREW BONE 14MMX3.5MM (Screw) ×9 IMPLANT
SCREW BONE 18 (Screw) ×9 IMPLANT
SCREW BONE 3.5X16MM (Screw) ×6 IMPLANT
SCREW BONE 3.5X20MM (Screw) ×3 IMPLANT
SLEEVE SCD COMPRESS KNEE MED (MISCELLANEOUS) IMPLANT
SPLINT FAST PLASTER 5X30 (CAST SUPPLIES) ×60
SPLINT PLASTER CAST FAST 5X30 (CAST SUPPLIES) ×30 IMPLANT
SPONGE LAP 4X18 X RAY DECT (DISPOSABLE) ×3 IMPLANT
STAPLER VISISTAT 35W (STAPLE) IMPLANT
STOCKINETTE 4X48 STRL (DRAPES) ×3 IMPLANT
STRIP CLOSURE SKIN 1/2X4 (GAUZE/BANDAGES/DRESSINGS) IMPLANT
SUCTION FRAZIER HANDLE 10FR (MISCELLANEOUS)
SUCTION TUBE FRAZIER 10FR DISP (MISCELLANEOUS) IMPLANT
SUT ETHILON 3 0 PS 1 (SUTURE) ×6 IMPLANT
SUT VIC AB 0 CT1 27 (SUTURE) ×2
SUT VIC AB 0 CT1 27XBRD ANBCTR (SUTURE) ×1 IMPLANT
SUT VIC AB 2-0 SH 27 (SUTURE) ×2
SUT VIC AB 2-0 SH 27XBRD (SUTURE) ×1 IMPLANT
SUT VICRYL 4-0 PS2 18IN ABS (SUTURE) IMPLANT
SYR BULB 3OZ (MISCELLANEOUS) ×3 IMPLANT
SYR CONTROL 10ML LL (SYRINGE) ×3 IMPLANT
TUBE CONNECTING 20'X1/4 (TUBING) ×2
TUBE CONNECTING 20X1/4 (TUBING) ×4 IMPLANT
UNDERPAD 30X30 (UNDERPADS AND DIAPERS) ×3 IMPLANT
YANKAUER SUCT BULB TIP NO VENT (SUCTIONS) ×3 IMPLANT

## 2015-11-08 NOTE — Anesthesia Postprocedure Evaluation (Signed)
Anesthesia Post Note  Patient: Devin Zimmerman  Procedure(s) Performed: Procedure(s) (LRB): OPEN REDUCTION INTERNAL FIXATION (ORIF) FIBULA FRACTURE, left (Left)  Patient location during evaluation: PACU Anesthesia Type: General and Regional Level of consciousness: awake and alert Pain management: pain level controlled Vital Signs Assessment: post-procedure vital signs reviewed and stable Respiratory status: spontaneous breathing, nonlabored ventilation, respiratory function stable and patient connected to nasal cannula oxygen Cardiovascular status: blood pressure returned to baseline and stable Postop Assessment: no signs of nausea or vomiting Anesthetic complications: no    Last Vitals:  Vitals:   11/08/15 1330 11/08/15 1345  BP: (!) 144/89 130/86  Pulse: (!) 103 99  Resp: (!) 24 16  Temp:      Last Pain:  Vitals:   11/08/15 1345  TempSrc:   PainSc: (P) 0-No pain                 Reino KentJudd, Karlye Ihrig J

## 2015-11-08 NOTE — Interval H&P Note (Signed)
History and Physical Interval Note:  11/08/2015 10:29 AM  Devin Zimmerman  has presented today for surgery, with the diagnosis of left fibula fracture  The various methods of treatment have been discussed with the patient and family. After consideration of risks, benefits and other options for treatment, the patient has consented to  Procedure(s) with comments: OPEN REDUCTION INTERNAL FIXATION (ORIF) FIBULA FRACTURE, left (Left) - Block as a surgical intervention .  The patient's history has been reviewed, patient examined, no change in status, stable for surgery.  I have reviewed the patient's chart and labs.  Questions were answered to the patient's satisfaction.     Loreta Aveaniel F Stephie Xu

## 2015-11-08 NOTE — Anesthesia Preprocedure Evaluation (Signed)
Anesthesia Evaluation  Patient identified by MRN, date of birth, ID band Patient awake    Reviewed: Allergy & Precautions, NPO status , Patient's Chart, lab work & pertinent test results  History of Anesthesia Complications Negative for: history of anesthetic complications  Airway Mallampati: II  TM Distance: >3 FB Neck ROM: Full    Dental no notable dental hx. (+) Dental Advisory Given   Pulmonary Current Smoker,    Pulmonary exam normal breath sounds clear to auscultation       Cardiovascular negative cardio ROS Normal cardiovascular exam Rhythm:Regular Rate:Normal     Neuro/Psych negative neurological ROS  negative psych ROS   GI/Hepatic negative GI ROS, Neg liver ROS,   Endo/Other  negative endocrine ROS  Renal/GU negative Renal ROS  negative genitourinary   Musculoskeletal negative musculoskeletal ROS (+)   Abdominal   Peds negative pediatric ROS (+)  Hematology negative hematology ROS (+)   Anesthesia Other Findings   Reproductive/Obstetrics negative OB ROS                             Anesthesia Physical Anesthesia Plan  ASA: II  Anesthesia Plan: General   Post-op Pain Management: GA combined w/ Regional for post-op pain   Induction: Intravenous  Airway Management Planned: LMA  Additional Equipment:   Intra-op Plan:   Post-operative Plan: Extubation in OR  Informed Consent: I have reviewed the patients History and Physical, chart, labs and discussed the procedure including the risks, benefits and alternatives for the proposed anesthesia with the patient or authorized representative who has indicated his/her understanding and acceptance.   Dental advisory given  Plan Discussed with: CRNA  Anesthesia Plan Comments:         Anesthesia Quick Evaluation  

## 2015-11-08 NOTE — Progress Notes (Signed)
Assisted Dr. Karie SchwalbeMary Judd with left, ultrasound guided, popliteal/saphenous block. Side rails up, monitors on throughout procedure. See vital signs in flow sheet. Tolerated Procedure well.

## 2015-11-08 NOTE — Transfer of Care (Signed)
Immediate Anesthesia Transfer of Care Note  Patient: Devin Zimmerman  Procedure(s) Performed: Procedure(s) with comments: OPEN REDUCTION INTERNAL FIXATION (ORIF) FIBULA FRACTURE, left (Left) - Block  Patient Location: PACU  Anesthesia Type:GA combined with regional for post-op pain  Level of Consciousness: awake and patient cooperative  Airway & Oxygen Therapy: Patient Spontanous Breathing and Patient connected to face mask oxygen  Post-op Assessment: Report given to RN and Post -op Vital signs reviewed and stable  Post vital signs: Reviewed and stable  Last Vitals:  Vitals:   11/08/15 1030 11/08/15 1045  BP: 118/81 125/78  Pulse: 74 70  Resp: 17 17  Temp:      Last Pain:  Vitals:   11/08/15 0913  TempSrc: Oral  PainSc: 7       Patients Stated Pain Goal: 3 (11/08/15 0913)  Complications: No apparent anesthesia complications

## 2015-11-08 NOTE — Anesthesia Procedure Notes (Addendum)
Procedure Name: LMA Insertion Date/Time: 11/08/2015 11:40 AM Performed by: Caren MacadamARTER, Carleton Van[place] W Pre-anesthesia Checklist: Patient identified, Emergency Drugs available, Suction available and Patient being monitored Patient Re-evaluated:Patient Re-evaluated prior to inductionOxygen Delivery Method: Circle system utilized Preoxygenation: Pre-oxygenation with 100% oxygen Intubation Type: IV induction Ventilation: Mask ventilation without difficulty LMA: LMA inserted LMA Size: 5.0 Number of attempts: 1 Airway Equipment and Method: Bite block Placement Confirmation: positive ETCO2 and breath sounds checked- equal and bilateral Tube secured with: Tape Dental Injury: Teeth and Oropharynx as per pre-operative assessment

## 2015-11-08 NOTE — Discharge Instructions (Signed)
Ankle Fracture Care After Refer to this sheet in the next few weeks. These discharge instructions provide you with general information on caring for yourself after you leave the hospital. Your caregiver may also give you specific instructions. Your treatment has been planned according to the most current medical practices available, but unavoidable complications sometimes occur. If you have any problems or questions after discharge, please call your caregiver. HOME INSTRUCTIONS You may resume a normal diet and activities as directed. Walk with crutches NON WEIGHT BEARING Do NOT get cast wet.  Do NOT remove dressing until you come back to the doctor Only take over-the-counter or prescription medicines for pain, discomfort, or fever as directed by your caregiver.  Eat a well-balanced diet.  Avoid lifting or driving until you are instructed otherwise.  Make an appointment to see your caregiver for stitches (suture) or staple removal as directed.    Post Anesthesia Home Care Instructions  Activity: Get plenty of rest for the remainder of the day. A responsible adult should stay with you for 24 hours following the procedure.  For the next 24 hours, DO NOT: -Drive a car -Advertising copywriter -Drink alcoholic beverages -Take any medication unless instructed by your physician -Make any legal decisions or sign important papers.  Meals: Start with liquid foods such as gelatin or soup. Progress to regular foods as tolerated. Avoid greasy, spicy, heavy foods. If nausea and/or vomiting occur, drink only clear liquids until the nausea and/or vomiting subsides. Call your physician if vomiting continues.  Special Instructions/Symptoms: Your throat may feel dry or sore from the anesthesia or the breathing tube placed in your throat during surgery. If this causes discomfort, gargle with warm salt water. The discomfort should disappear within 24 hours.  If you had a scopolamine patch placed behind your  ear for the management of post- operative nausea and/or vomiting:  1. The medication in the patch is effective for 72 hours, after which it should be removed.  Wrap patch in a tissue and discard in the trash. Wash hands thoroughly with soap and water. 2. You may remove the patch earlier than 72 hours if you experience unpleasant side effects which may include dry mouth, dizziness or visual disturbances. 3. Avoid touching the patch. Wash your hands with soap and water after contact with the patch.   Regional Anesthesia Blocks  1. Numbness or the inability to move the "blocked" extremity may last from 3-48 hours after placement. The length of time depends on the medication injected and your individual response to the medication. If the numbness is not going away after 48 hours, call your surgeon.  2. The extremity that is blocked will need to be protected until the numbness is gone and the  Strength has returned. Because you cannot feel it, you will need to take extra care to avoid injury. Because it may be weak, you may have difficulty moving it or using it. You may not know what position it is in without looking at it while the block is in effect.  3. For blocks in the legs and feet, returning to weight bearing and walking needs to be done carefully. You will need to wait until the numbness is entirely gone and the strength has returned. You should be able to move your leg and foot normally before you try and bear weight or walk. You will need someone to be with you when you first try to ensure you do not fall and possibly risk injury.  4.  Bruising and tenderness at the needle site are common side effects and will resolve in a few days.  5. Persistent numbness or new problems with movement should be communicated to the surgeon or the [facility] [phone])/ [facility] [phone]).  SEEK MEDICAL CARE IF: You have swelling of your calf or leg.  You develop shortness  of breath or chest pain.  You have redness, swelling, or increasing pain in the wound.  There is pus or any unusual drainage coming from the surgical site.  You notice a bad smell coming from the surgical site or dressing.  The surgical site breaks open after sutures or staples have been removed.  There is persistent bleeding from the suture or staple line.  You are getting worse or are not improving.  You have any other questions or concerns.  SEEK IMMEDIATE MEDICAL CARE IF:  You have a fever.  You develop a rash.  You have difficulty breathing.  You develop any reaction or side effects to medicines given.  Your knee motion is decreasing rather than improving.  MAKE SURE YOU:  Understand these instructions.  Will watch your condition.  Will get help right away if you are not doing well or get worse.

## 2015-11-08 NOTE — Anesthesia Procedure Notes (Signed)
Anesthesia Regional Block:  Popliteal block  Pre-Anesthetic Checklist: ,, timeout performed, Correct Patient, Correct Site, Correct Laterality, Correct Procedure, Correct Position, site marked, Risks and benefits discussed,  Surgical consent,  Pre-op evaluation,  At surgeon's request and post-op pain management  Laterality: Left  Prep: Maximum Sterile Barrier Precautions used, chloraprep       Needles:  Injection technique: Single-shot  Needle Type: Echogenic Stimulator Needle     Needle Length: 10cm 10 cm Needle Gauge: 21 G    Additional Needles:  Procedures: ultrasound guided (picture in chart) Popliteal block Narrative:  Injection made incrementally with aspirations every 5 mL.  Performed by: Personally   Additional Notes: Patient tolerated the procedure well without complications. This was a pop/saph block

## 2015-11-09 ENCOUNTER — Encounter (HOSPITAL_BASED_OUTPATIENT_CLINIC_OR_DEPARTMENT_OTHER): Payer: Self-pay | Admitting: Orthopedic Surgery

## 2015-11-09 NOTE — Op Note (Signed)
[name], Devin               ACCOUNT NO.:  [id number]  MEDICAL RECORD NO.:  [id number]  LOCATION:                                 FACILITY:  PHYSICIAN:  Loreta Aveaniel F. Sybrina Laning, M.D. DATE OF BIRTH:  September 05, 1990  DATE OF PROCEDURE:  11/08/2015 DATE OF DISCHARGE:                              OPERATIVE REPORT   PREOPERATIVE DIAGNOSIS:  Posteriorly displaced shortened lateral malleolus fracture, left ankle.  POSTOPERATIVE DIAGNOSIS:  Posteriorly displaced shortened lateral malleolus fracture, left ankle.  No deltoid or syndesmotic instability.  PROCEDURE:  Open reduction and internal fixation of lateral malleolus fracture with a contoured distal fibular St[employer]ker Titanium plate and screws.  SURGEON:  Loreta Aveaniel F. Bernis Schreur, M.D.  ASSISTANT:  Laural BenesJane B. Jannet Mantisoberts, P.A.  ANESTHESIA:  General.  BLOOD LOSS:  Minimal.  SPECIMENS:  None.  CULTURES:  None.  COMPLICATIONS:  None.  DRESSINGS:  Soft compressive short leg splint.  TOURNIQUET TIME:  50 minutes.  DESCRIPTION OF PROCEDURE:  The patient was brought to the operating room and after adequate anesthesia had been obtained, tourniquet applied, prepped and draped in usual sterile fashion.  Exsanguinated with elevation of Esmarch.  Tourniquet inflated to 350 mmHg.  Stress views obtained with fluoroscopy.  Displaced shortened fibular fracture __________ the mortise, but the medial side in the syndesmosis really did not open.  Lateral approach.  Subperiosteal exposure.  The distal fracture was brought out to adequate length and then brought up to the anatomic position.  It was then solidly fixed with a precontoured 8-hole St[employer]ker Titanium plate and screws.  At completion, nice solid stable fixation, anatomic alignment.  Wound irrigated. Closed in a layered manner with Vicryl and nylon.  Sterile compressive dressing applied.  Shoulder splint applied.  Tourniquet deflated and removed.  Anesthesia reversed.  Brought to the recovery room.   Tolerated the surgery well.  No complications.     Loreta Aveaniel F. Winson Eichorn, M.D.   ______________________________ Loreta Aveaniel F. Danniell Rotundo, M.D.    DFM/MEDQ  D:  11/08/2015  T:  11/09/2015  Job:  [id number]

## 2015-11-30 ENCOUNTER — Ambulatory Visit: Payer: BLUE CROSS/BLUE SHIELD | Admitting: Family Medicine

## 2015-12-13 ENCOUNTER — Ambulatory Visit (INDEPENDENT_AMBULATORY_CARE_PROVIDER_SITE_OTHER): Payer: BLUE CROSS/BLUE SHIELD | Admitting: Family Medicine

## 2015-12-13 ENCOUNTER — Encounter: Payer: Self-pay | Admitting: Family Medicine

## 2015-12-13 VITALS — BP 120/82 | HR 84 | Temp 97.7°F | Resp 16 | Ht 70.0 in | Wt 216.8 lb

## 2015-12-13 DIAGNOSIS — F411 Generalized anxiety disorder: Secondary | ICD-10-CM

## 2015-12-13 DIAGNOSIS — F329 Major depressive disorder, single episode, unspecified: Secondary | ICD-10-CM | POA: Diagnosis not present

## 2015-12-13 DIAGNOSIS — F32A Depression, unspecified: Secondary | ICD-10-CM
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
## 2015-12-13 NOTE — Progress Notes (Signed)
Pre visit review using our clinic review tool, if applicable. No additional management support is needed unless otherwise documented below in the visit note. 

## 2016-02-08 ENCOUNTER — Ambulatory Visit (INDEPENDENT_AMBULATORY_CARE_PROVIDER_SITE_OTHER): Payer: BLUE CROSS/BLUE SHIELD | Admitting: Family Medicine

## 2016-02-08 ENCOUNTER — Encounter: Payer: Self-pay | Admitting: Family Medicine

## 2016-02-08 VITALS — BP 127/89 | HR 79 | Temp 98.0°F | Resp 16 | Ht 70.0 in | Wt 218.8 lb

## 2016-02-08 DIAGNOSIS — F419 Anxiety disorder, unspecified: Principal | ICD-10-CM

## 2016-02-08 DIAGNOSIS — F418 Other specified anxiety disorders: Secondary | ICD-10-CM | POA: Diagnosis not present

## 2016-02-08 DIAGNOSIS — F909 Attention-deficit hyperactivity disorder, unspecified type: Secondary | ICD-10-CM | POA: Diagnosis not present

## 2016-02-08 DIAGNOSIS — F329 Major depressive disorder, single episode, unspecified: Secondary | ICD-10-CM

## 2016-02-08 MED ORDER — CLONIDINE HCL 0.1 MG PO TABS: 0.1000 mg | ORAL_TABLET | Freq: Three times a day (TID) | ORAL | 1 refills | Status: DC

## 2016-02-08 NOTE — Progress Notes (Signed)
OFFICE VISIT  02/08/2016   CC:  Chief Complaint  Patient presents with  . Follow-up    Mood and Anxiety   HPI:    Patient is a 26 y.o. Caucasian male who presents for 2 mo f/u anxiety and depression. He has been on the duloxetine 60 mg qd dosing for about 4 months. He tolerates 5mg  buspar tid but attemps at 15mg  tid caused too much nausea.  He still has significant anger and anxiety.   Anxiety is generalized.  No panic attacks.   Sleep: hard time sleeping due to mind "not shutting down".   Irritable at home and work.  Energy level is fair, definitely not excessive. Some 1-2 day periods of feeling sadness/depression but nothing prolonged or severe. Appetite is very good.  Concentration seems to be a problem for him, easily distracted/poor focus.  Starts tasks and doesn't finish them--common for him.  Impatience.  Feels like he's churning inside with the need to always be doing something.  No euphoria, excessive talkativeness, risk taking behavior, or periods in which he doesn't need much sleep.   He has never been on any medication for attention deficit disorder.  Insurance has prevented him from seeing the two counselors we have tried to refer him to.   Past Medical History:  Diagnosis Date  . Anxiety and depression   . Difficulty swallowing pills   . Fibula fracture 10/2015   left    Past Surgical History:  Procedure Laterality Date  . ORIF FIBULA FRACTURE Left 11/08/2015   Procedure: OPEN REDUCTION INTERNAL FIXATION (ORIF) FIBULA FRACTURE, left;  Surgeon: Loreta Aveaniel F Murphy, MD;  Location: [facility];  Service: Orthopedics;  Laterality: Left;  Block    Outpatient Medications Prior to Visit  Medication Sig Dispense Refill  . busPIRone (BUSPAR) 15 MG tablet Take 1 tablet (15 mg total) by mouth 3 (three) times daily. 90 tablet 2  . D3-50 50000 units capsule Take 1 capsule by mouth once a week.  0  . DULoxetine (CYMBALTA) 60 MG capsule Take 1 capsule (60 mg  total) by mouth daily. 30 capsule 6  . oxyCODONE-acetaminophen (ROXICET) 5-325 MG tablet Take 1 tablet by mouth every 4 (four) hours as needed for severe pain. 50 tablet 0   No facility-administered medications prior to visit.     Allergies  Allergen Reactions  . Ceclor [Cefaclor] Other (See Comments)    UNKNOWN    ROS As per HPI  PE: Blood pressure 127/89, pulse 79, temperature 98 F (36.7 C), temperature source Oral, resp. rate 16, height 5\' 10"  (1.778 m), weight 218 lb 12 oz (99.2 kg), SpO2 99 %. Wt Readings from Last 2 Encounters:  02/08/16 218 lb 12 oz (99.2 kg)  12/13/15 216 lb 12 oz (98.3 kg)    Gen: alert, oriented x 4, affect pleasant.  Lucid thinking and conversation noted. HEENT: PERRLA, EOMI.   NEURO: no tremor or tics noted on observation.  Coordination intact. CN 2-12 grossly intact bilaterally, strength 5/5 in all extremeties.  No ataxia.  LABS:  none  IMPRESSION AND PLAN:  1) Depression: improved significantly with duloxetine 60mg  qd. Continue this.  2) Anxiety; this has not responded as well to duloxetine.  He will try to titrate his buspar up slowly as tolerated (GI side effects) to 15mg  tid.  3) Possible adult ADHD: he certainly has some core symptoms, but these can also be symptoms of mood/anxiety disorders.  I want to avoid stimulants in him b/c these  will likely make his anxiety worse. Intuniv is an option, but for cost considerations, will try clonidine instead: 0.1mg  qhs and increase by one tab per day every 3 days until he gets to 0.1mg  tid dosing.    An After Visit Summary was printed and given to the patient.  FOLLOW UP: Return in about 6 weeks (around 03/21/2016) for f/u anx/mood/focus.  Signed:  Santiago Bumpers, MD           02/08/2016

## 2016-02-08 NOTE — Progress Notes (Signed)
Pre visit review using our clinic review tool, if applicable. No additional management support is needed unless otherwise documented below in the visit note. 

## 2016-02-14 DIAGNOSIS — S6990XA Unspecified injury of unspecified wrist, hand and finger(s), initial encounter: Secondary | ICD-10-CM

## 2016-02-14 HISTORY — DX: Unspecified injury of unspecified wrist, hand and finger(s), initial encounter: S69.90XA

## 2016-03-13 ENCOUNTER — Encounter: Payer: Self-pay | Admitting: Physician Assistant

## 2016-03-13 ENCOUNTER — Ambulatory Visit (INDEPENDENT_AMBULATORY_CARE_PROVIDER_SITE_OTHER): Payer: BLUE CROSS/BLUE SHIELD

## 2016-03-13 ENCOUNTER — Ambulatory Visit (INDEPENDENT_AMBULATORY_CARE_PROVIDER_SITE_OTHER): Payer: BLUE CROSS/BLUE SHIELD | Admitting: Physician Assistant

## 2016-03-13 VITALS — BP 126/84 | HR 102 | Temp 98.6°F | Ht 70.0 in | Wt 219.2 lb

## 2016-03-13 DIAGNOSIS — S6992XA Unspecified injury of left wrist, hand and finger(s), initial encounter: Secondary | ICD-10-CM | POA: Diagnosis not present

## 2016-03-13 NOTE — Progress Notes (Signed)
Pre visit review using our clinic review tool, if applicable. No additional management support is needed unless otherwise documented below in the visit note. 

## 2016-03-13 NOTE — Patient Instructions (Addendum)
It was great meeting you today!  Rest and ice your finger.  Continue ibuprofen as needed. Follow-up with your PCP if your pain does not improve.   Finger Sprain A finger sprain is an injury to one of the strong bands of tissue (ligaments) that connect the bones in the finger. The ligament can be stretched too much, or it can tear. A tear can be either partial or complete. The severity of the sprain depends on how much of the ligament was damaged or torn. CAUSES This injury is often caused by a fall or an accident. For example, if you extend your hands to catch an object or to protect yourself during a fall, the force of impact may cause the ligaments in your finger to stretch too much. RISK FACTORS The following factors may make you more likely to have this injury:  Playing sports that involve a greater risk of falling, such as skiing.  Playing sports that involve catching an object, such as basketball.  Having poor strength and flexibility. SYMPTOMS Symptoms of this condition include:  Pain at the affected finger joint, especially when bending or extending the finger.  Loss of motion in the finger.  Swelling.  Tenderness.  Bruising. DIAGNOSIS This condition is diagnosed with a medical history and physical exam. You may also have an X-ray of your finger to rule out a fracture or dislocation. TREATMENT Treatment varies depending on the severity of the sprain. If your ligament is overstretched or partially torn, treatment usually involves:  Keeping the finger in a fixed position (immobilization) for a period of time. To help you do this, your health care provider may apply a bandage, splint, or cast to keep the finger from moving until it heals. In some cases, the finger may be taped to the fingers beside it (buddy taping).  Taking medicines for pain.  Doing exercises for the finger after it has begun to heal. If your ligament is fully torn, you may need surgery to reconnect the  ligament to the bone. After surgery, a cast or splint will be applied. HOME CARE INSTRUCTIONS If You Have a Splint:   Wear the splint as told by your health care provider. Remove it only as told by your health care provider.  Loosen the splint if your fingers tingle, become numb, or turn cold and blue.  Do not let your splint get wet if it is not waterproof.  Keep the splint clean. If You Have a Cast:   Do not stick anything inside the cast to scratch your skin. Doing that increases your risk of infection.  Check the skin around the cast every day. Tell your health care provider about any concerns.  You may put lotion on dry skin around the edges of the cast. Do not put lotion on the skin underneath the cast.  Do not let your cast get wet if it is not waterproof.  Keep the cast clean. Bathing   If your splint or cast is not waterproof, cover it with a watertight plastic bag when you take a bath or a shower.  Keep any bandages (dressings) dry until your health care provider says they can be removed. Managing Pain, Stiffness, and Swelling   If directed, put ice on the injured area:  Put ice in a plastic bag.  Place a towel between your skin and the bag.  Leave the ice on for 20 minutes, 2-3 times a day.  Move your fingers often to avoid stiffness and to  lessen swelling.  Raise (elevate) the injured area above the level of your heart while you are sitting or lying down. General Instructions   Do not put pressure on any part of the cast or splint until it is fully hardened. This may take several hours.  Take over-the-counter and prescription medicines only as told by your health care provider.  Do not drive or operate heavy machinery while taking prescription pain medicine.  Do exercises as told by your health care provider or physical therapist.  Do not wear rings on your injured finger.  Keep all follow-up visits as told by your health care provider. This is  important. SEEK MEDICAL CARE IF:  Your pain is not controlled with medicine.  Your bruising or swelling gets worse.  Your cast or splint is damaged.  Your finger is numb or blue.  Your finger feels colder than normal. This information is not intended to replace advice given to you by your health care provider. Make sure you discuss any questions you have with your health care provider. Document Released: 02/07/2004 Document Revised: 04/23/2015 Document Reviewed: 11/09/2014 Elsevier Interactive Patient Education  2017 [employer].

## 2016-03-13 NOTE — Progress Notes (Signed)
Subjective:    Patient ID: Devin Zimmerman, male    DOB: 07/06/1990, 26 y.o.   MRN: [id number]  HPI  Devin Zimmerman is a 26 y/o male who presents today for finger pain.  Yesterday patient was working on his car and the car dropped, and he doesn't remember his hand getting caught, however he noticed today that his L pinky finger was painful and swollen. He did not break the skin. He denies numbness and tingling. Has taken some ibuprofen without relief. He has not injured this hand before. He has taped his fingers together to help with his symptoms.  Review of Systems  See HPI  Past Medical History:  Diagnosis Date  . Anxiety and depression   . Difficulty swallowing pills   . Fibula fracture 10/2015   left     Social History   Social History  . Marital status: Married    Spouse name: N/A  . Number of children: N/A  . Years of education: N/A   Occupational History  . Not on file.   Social History Main Topics  . Smoking status: Current Every Day Smoker    Packs/day: 0.50    Years: 5.00    Types: Cigarettes  . Smokeless tobacco: Never Used  . Alcohol use Yes     Comment: occasionally  . Drug use: No  . Sexual activity: Not on file   Other Topics Concern  . Not on file   Social History Narrative   Married, 2 children (ages 983 and 1).   Occupation: Engineer, siteHVAC technician.   Education: finished 10th grade   No tobacco, occ alcohol, no hx of ETOH abuse or drug use.          Past Surgical History:  Procedure Laterality Date  . ORIF FIBULA FRACTURE Left 11/08/2015   Procedure: OPEN REDUCTION INTERNAL FIXATION (ORIF) FIBULA FRACTURE, left;  Surgeon: Loreta Aveaniel F Murphy, MD;  Location: [facility];  Service: Orthopedics;  Laterality: Left;  Block    No family history on file.  Allergies  Allergen Reactions  . Ceclor [Cefaclor] Other (See Comments)    UNKNOWN    Current Outpatient Prescriptions on File Prior to Visit  Medication Sig Dispense Refill  . busPIRone  (BUSPAR) 15 MG tablet Take 1 tablet (15 mg total) by mouth 3 (three) times daily. 90 tablet 2  . cloNIDine (CATAPRES) 0.1 MG tablet Take 1 tablet (0.1 mg total) by mouth 3 (three) times daily. 90 tablet 1  . D3-50 50000 units capsule Take 1 capsule by mouth once a week.  0  . DULoxetine (CYMBALTA) 60 MG capsule Take 1 capsule (60 mg total) by mouth daily. 30 capsule 6   No current facility-administered medications on file prior to visit.     BP 126/84 (BP Location: Right Arm, Patient Position: Sitting, Cuff Size: Normal)   Pulse (!) 102   Temp 98.6 F (37 C) (Oral)   Ht 5\' 10"  (1.778 m)   Wt 219 lb 4 oz (99.5 kg)   SpO2 96%   BMI 31.46 kg/m        Objective:   Physical Exam  Constitutional: He appears well-developed and well-nourished. He is cooperative.  Non-toxic appearance. He does not have a sickly appearance. He does not appear ill. No distress.  Cardiovascular: Normal rate, regular rhythm and normal heart sounds.   Pulmonary/Chest: Effort normal and breath sounds normal. No accessory muscle usage. No respiratory distress.  Musculoskeletal:  Left hand: He exhibits tenderness and bony tenderness. He exhibits normal capillary refill and no deformity. Normal sensation noted.  Tenderness and swelling to PIP joint and middle phalanx of L 5th finger; no laceration  Neurological: He is alert.  Skin: Skin is warm, dry and intact.  Nursing note and vitals reviewed.  L 5th finger xray: reviewed with Dr. Helane Rima, no acute abnormalities seen     Assessment & Plan:  1. Finger injury, left, initial encounter Initial read of xray does not show fracture, however I will await radiology read. Patient put in finger splint. Discussed return precautions. Follow-up if no improvement of symptoms or worsening changes. - DG Finger Little Left   Jarold Motto PA-C 03/13/16

## 2016-03-16 ENCOUNTER — Encounter: Payer: Self-pay | Admitting: Family Medicine

## 2016-03-21 ENCOUNTER — Ambulatory Visit: Payer: BLUE CROSS/BLUE SHIELD | Admitting: Family Medicine

## 2016-03-28 ENCOUNTER — Ambulatory Visit: Payer: BLUE CROSS/BLUE SHIELD | Admitting: Family Medicine

## 2016-08-04 ENCOUNTER — Emergency Department (HOSPITAL_BASED_OUTPATIENT_CLINIC_OR_DEPARTMENT_OTHER): Payer: Medicaid Other

## 2016-08-04 ENCOUNTER — Encounter (HOSPITAL_BASED_OUTPATIENT_CLINIC_OR_DEPARTMENT_OTHER): Payer: Self-pay | Admitting: Emergency Medicine

## 2016-08-04 ENCOUNTER — Emergency Department (HOSPITAL_BASED_OUTPATIENT_CLINIC_OR_DEPARTMENT_OTHER)
Admission: EM | Admit: 2016-08-04 | Discharge: 2016-08-04 | Disposition: A | Discharge status: Home / Self Care | Payer: Medicaid Other | Attending: Emergency Medicine | Admitting: Emergency Medicine

## 2016-08-04 DIAGNOSIS — Y92009 Unspecified place in unspecified non-institutional (private) residence as the place of occurrence of the external cause: Secondary | ICD-10-CM | POA: Diagnosis not present

## 2016-08-04 DIAGNOSIS — S61217A Laceration without foreign body of left little finger without damage to nail, initial encounter: Secondary | ICD-10-CM | POA: Diagnosis not present

## 2016-08-04 DIAGNOSIS — S61312A Laceration without foreign body of right middle finger with damage to nail, initial encounter: Secondary | ICD-10-CM | POA: Insufficient documentation

## 2016-08-04 DIAGNOSIS — S61209A Unspecified open wound of unspecified finger without damage to nail, initial encounter: Secondary | ICD-10-CM

## 2016-08-04 DIAGNOSIS — F17210 Nicotine dependence, cigarettes, uncomplicated: Secondary | ICD-10-CM | POA: Diagnosis not present

## 2016-08-04 DIAGNOSIS — S61411A Laceration without foreign body of right hand, initial encounter: Secondary | ICD-10-CM

## 2016-08-04 DIAGNOSIS — X58XXXA Exposure to other specified factors, initial encounter: Secondary | ICD-10-CM | POA: Diagnosis not present

## 2016-08-04 DIAGNOSIS — Z23 Encounter for immunization: Secondary | ICD-10-CM | POA: Diagnosis not present

## 2016-08-04 DIAGNOSIS — S6991XA Unspecified injury of right wrist, hand and finger(s), initial encounter: Secondary | ICD-10-CM | POA: Diagnosis present

## 2016-08-04 DIAGNOSIS — Y999 Unspecified external cause status: Secondary | ICD-10-CM | POA: Diagnosis not present

## 2016-08-04 DIAGNOSIS — Y939 Activity, unspecified: Secondary | ICD-10-CM | POA: Diagnosis not present

## 2016-08-04 DIAGNOSIS — S61309A Unspecified open wound of unspecified finger with damage to nail, initial encounter: Secondary | ICD-10-CM

## 2016-08-04 MED ORDER — LIDOCAINE HCL (PF) 1 % IJ SOLN
5.0000 mL | Freq: Once | INTRAMUSCULAR | Status: AC
  Administered 2016-08-04: 5 mL
  Filled 2016-08-04: qty 5

## 2016-08-04 MED ORDER — TETANUS-DIPHTH-ACELL PERTUSSIS 5-2.5-18.5 LF-MCG/0.5 IM SUSP
0.5000 mL | Freq: Once | INTRAMUSCULAR | Status: AC
  Administered 2016-08-04: 0.5 mL via INTRAMUSCULAR
  Filled 2016-08-04: qty 0.5

## 2016-08-04 NOTE — Discharge Instructions (Signed)
Read the information below.  You may return to the Emergency Department at any time for worsening condition or any new symptoms that concern you.   Please keep your wounds clean and covered with a thin layer of antibiotic ointment until they are healed.  Keep your stitches dry as much as possible.  If you develop redness, swelling, pus draining from the wound, or fevers greater than 100.4, return to the ER immediately for a recheck.

## 2016-08-04 NOTE — ED Triage Notes (Signed)
Pt has laceration to base of 5th finger on right hand.  Bleeding controlled.  Possible nail.

## 2016-08-04 NOTE — ED Provider Notes (Signed)
[facility] Provider Note   CSN: [id number] Arrival date & time: 08/04/16  0849     History   Chief Complaint Chief Complaint  Patient presents with  . Laceration    HPI Devin Zimmerman is a 26 y.o. male.  HPI   Right handed patient presents with laceration to the base of the 5th finger that occurred this morning while he was attempting to fix his porch and clear out a tree growing through it.  States he noticed he was bleeding, does not know what cut him.  He was pulling up a board that had nails through it but was also using a branch cutter to cut the sapling.  Notes the finger is tingling and the cut is painful.  No other injury.  No weakness.  Unknown tetanus.    Past Medical History:  Diagnosis Date  . Anxiety and depression   . Difficulty swallowing pills   . Fibula fracture 10/2015   left  . Finger injury 02/2016    Patient Active Problem List   Diagnosis Date Noted  . Right wrist injury 05/21/2015    Past Surgical History:  Procedure Laterality Date  . ORIF FIBULA FRACTURE Left 11/08/2015   Procedure: OPEN REDUCTION INTERNAL FIXATION (ORIF) FIBULA FRACTURE, left;  Surgeon: Loreta Aveaniel F Murphy, MD;  Location: [facility];  Service: Orthopedics;  Laterality: Left;  Block       Home Medications    Prior to Admission medications   Not on File    Family History No family history on file.  Social History Social History  Substance Use Topics  . Smoking status: Current Every Day Smoker    Packs/day: 0.50    Years: 5.00    Types: Cigarettes  . Smokeless tobacco: Never Used  . Alcohol use Yes     Comment: occasionally     Allergies   Ceclor [cefaclor]   Review of Systems Review of Systems  Constitutional: Negative for fever.  Musculoskeletal: Negative for arthralgias.  Skin: Positive for wound. Negative for color change.  Allergic/Immunologic: Negative for immunocompromised state.  Neurological: Negative for weakness.    Hematological: Does not bruise/bleed easily.  Psychiatric/Behavioral: Positive for self-injury (accidental ).     Physical Exam Updated Vital Signs BP 123/82 (BP Location: Left Arm)   Pulse 71   Temp 98.2 F (36.8 C) (Oral)   Ht 5\' 9"  (1.753 m)   Wt 88.5 kg (195 lb)   SpO2 100%   BMI 28.80 kg/m   Physical Exam  Constitutional: He appears well-developed and well-nourished. No distress.  HENT:  Head: Normocephalic and atraumatic.  Neck: Neck supple.  Pulmonary/Chest: Effort normal.  Musculoskeletal:  Laceration at base of 5th finger on right hand, radial aspect.  Skin avulsion distal to this.  Flexes PIP and DIP well. Full strength at MCP. Distal sensation intact, capillary refill < 2 seconds.  3rd finger nail with partial avulsion, nailbed without laceration.   Neurological: He is alert.  Skin: He is not diaphoretic.  Nursing note and vitals reviewed.    ED Treatments / Results  Labs (all labs ordered are listed, but only abnormal results are displayed) Labs Reviewed - No data to display  EKG  EKG Interpretation None       Radiology Dg Hand Complete Right  Result Date: 08/04/2016 CLINICAL DATA:  Injury. EXAM: RIGHT HAND - COMPLETE 3+ VIEW COMPARISON:  05/21/2015 . FINDINGS: No acute bony or joint abnormality identified. No evidence of fracture  or dislocation. IMPRESSION: No acute abnormality. Electronically Signed   By: Maisie Fus  Register   On: 08/04/2016 09:23    Procedures Procedures (including critical care time)  LACERATION REPAIR Performed by: Trixie Dredge Authorized by: Trixie Dredge Consent: Verbal consent obtained. Risks and benefits: risks, benefits and alternatives were discussed Consent given by: patient Patient identity confirmed: provided demographic data Prepped and Draped in normal sterile fashion Wound explored  Laceration Location: right 5th finger, base  Laceration Length: 2.5 cm  No Foreign Bodies seen or palpated  Anesthesia: local  infiltration  Local anesthetic: lidocaine 2% no epinephrine  Anesthetic total: 5 ml  Irrigation method: syringe Amount of cleaning: standard  Skin closure: 5-0 vicryl plus  Number of sutures: 6  Technique: simple interrupted   Patient tolerance: Patient tolerated the procedure well with no immediate complications.   Medications Ordered in ED Medications  lidocaine (PF) (XYLOCAINE) 1 % injection 5 mL (5 mLs Infiltration Given 08/04/16 0925)  Tdap (BOOSTRIX) injection 0.5 mL (0.5 mLs Intramuscular Given 08/04/16 0925)     Initial Impression / Assessment and Plan / ED Course  I have reviewed the triage vital signs and the nursing notes.  Pertinent labs & imaging results that were available during my care of the patient were reviewed by me and considered in my medical decision making (see chart for details).    Afebrile, nontoxic patient with injury to his right hand while pulling up a plank from his porch.  Likely cut with nail.   Xray negative.  No tendon injury.  Neurovascularly intact.  Repaired in ED.  Minor nail injury, clipped in the midportion of the nail.  No injury noted to nailbed. Pt aware the nail may grow back irregularly.  Pt also with avulsed skin unable to close.  Discussed home wound care and return precautions.   D/C home.  Discussed result, findings, treatment, and follow up  with patient.  Pt given return precautions.  Pt verbalizes understanding and agrees with plan.      Final Clinical Impressions(s) / ED Diagnoses   Final diagnoses:  Laceration of right hand without foreign body, initial encounter  Nail avulsion, finger, initial encounter  Avulsion of skin of finger, initial encounter    New Prescriptions There are no discharge medications for this patient.    Trixie Dredge, PA-C 08/04/16 1212    Alvira Monday, MD 08/05/16 1143

## 2017-07-17 DIAGNOSIS — F411 Generalized anxiety disorder: Secondary | ICD-10-CM | POA: Insufficient documentation

## 2017-07-17 DIAGNOSIS — F900 Attention-deficit hyperactivity disorder, predominantly inattentive type: Secondary | ICD-10-CM | POA: Insufficient documentation

## 2017-07-17 DIAGNOSIS — F5101 Primary insomnia: Secondary | ICD-10-CM | POA: Insufficient documentation

## 2017-12-02 ENCOUNTER — Encounter: Payer: Self-pay | Admitting: *Deleted

## 2018-03-10 ENCOUNTER — Encounter (HOSPITAL_BASED_OUTPATIENT_CLINIC_OR_DEPARTMENT_OTHER): Payer: Self-pay

## 2018-03-10 ENCOUNTER — Other Ambulatory Visit: Payer: Self-pay

## 2018-03-10 ENCOUNTER — Emergency Department (HOSPITAL_BASED_OUTPATIENT_CLINIC_OR_DEPARTMENT_OTHER)
Admission: EM | Admit: 2018-03-10 | Discharge: 2018-03-10 | Disposition: A | Discharge status: Home / Self Care | Payer: BLUE CROSS/BLUE SHIELD | Attending: Emergency Medicine | Admitting: Emergency Medicine

## 2018-03-10 DIAGNOSIS — R1031 Right lower quadrant pain: Secondary | ICD-10-CM | POA: Diagnosis not present

## 2018-03-10 DIAGNOSIS — F17210 Nicotine dependence, cigarettes, uncomplicated: Secondary | ICD-10-CM | POA: Diagnosis not present

## 2018-03-10 MED ORDER — HYDROCODONE-ACETAMINOPHEN 5-325 MG PO TABS: 1.0000 | ORAL_TABLET | Freq: Four times a day (QID) | ORAL | 0 refills | Status: DC | PRN

## 2018-03-10 NOTE — ED Provider Notes (Signed)
[facility] EMERGENCY DEPARTMENT Provider Note   CSN: [id number] Arrival date & time: 03/10/18  2004    History   Chief Complaint Chief Complaint  Patient presents with  . Groin Pain    HPI Devin Zimmerman is a 28 y.o. male.     Patient is a 28 year old male with no significant past medical history.  He presents today with complaints of pain in the right groin.  This started 2 days ago and is worsening.  He denies any injury or trauma.  He denies any vomiting, diarrhea or constipation.  The history is provided by the patient.  Groin Pain  This is a new problem. The current episode started 2 days ago. The problem occurs constantly. The problem has been gradually worsening. The symptoms are aggravated by walking (Palpation). He has tried nothing for the symptoms.    Past Medical History:  Diagnosis Date  . Anxiety and depression   . Difficulty swallowing pills   . Fibula fracture 10/2015   left  . Finger injury 02/2016    Patient Active Problem List   Diagnosis Date Noted  . Right wrist injury 05/21/2015    Past Surgical History:  Procedure Laterality Date  . ORIF FIBULA FRACTURE Left 11/08/2015   Procedure: OPEN REDUCTION INTERNAL FIXATION (ORIF) FIBULA FRACTURE, left;  Surgeon: Loreta Ave, MD;  Location: [facility];  Service: Orthopedics;  Laterality: Left;  Block        Home Medications    Prior to Admission medications   Not on File    Family History No family history on file.  Social History Social History   Tobacco Use  . Smoking status: Current Every Day Smoker    Packs/day: 0.50    Years: 5.00    Pack years: 2.50    Types: Cigarettes  . Smokeless tobacco: Never Used  Substance Use Topics  . Alcohol use: Yes    Comment: occasionally  . Drug use: No     Allergies   Ceclor [cefaclor]   Review of Systems Review of Systems  All other systems reviewed and are negative.    Physical Exam Updated Vital  Signs BP 134/79 (BP Location: Left Arm)   Pulse 98   Temp 97.8 F (36.6 C) (Oral)   Resp 14   Ht 6' (1.829 m)   Wt 91.4 kg   SpO2 100%   BMI 27.33 kg/m   Physical Exam Vitals signs and nursing note reviewed.  Constitutional:      Appearance: Normal appearance.  HENT:     Head: Normocephalic and atraumatic.  Cardiovascular:     Rate and Rhythm: Normal rate.  Pulmonary:     Effort: Pulmonary effort is normal.  Abdominal:     Tenderness: There is abdominal tenderness. There is no guarding.     Comments: There is tenderness to palpation in the right inguinal region.  There is a palpable nodule in the right inguinal region, however no definite defect.  Musculoskeletal: Normal range of motion.  Skin:    General: Skin is warm and dry.      ED Treatments / Results  Labs (all labs ordered are listed, but only abnormal results are displayed) Labs Reviewed - No data to display  EKG None  Radiology No results found.  Procedures Procedures (including critical care time)  Medications Ordered in ED Medications - No data to display   Initial Impression / Assessment and Plan / ED Course  I have  reviewed the triage vital signs and the nursing notes.  Pertinent labs & imaging results that were available during my care of the patient were reviewed by me and considered in my medical decision making (see chart for details).  Patient presents with right groin pain.  He has what is either a lymph node or possible hernia in the inguinal region.  It does not appear to be incarcerated.  Patient to follow-up with general surgery if not improving with ibuprofen and rest.  Final Clinical Impressions(s) / ED Diagnoses   Final diagnoses:  None    ED Discharge Orders    None       Geoffery Lyons, MD 03/10/18 2118

## 2018-03-10 NOTE — Discharge Instructions (Addendum)
Ibuprofen 600 mg every 6 hours as needed for pain.  Hydrocodone as prescribed as needed for pain not relieved with ibuprofen.  Follow-up with general surgery in the next few days for a follow-up exam.  The contact information for [facility] surgery has been provided in this discharge summary for you to call and make these arrangements.  Return to the ER in the meantime if you develop worsening pain, high fever, vomiting, or other new and concerning symptoms.

## 2018-03-10 NOTE — ED Triage Notes (Signed)
C/o pain and a lump to right groin area x 2-3 days-pain radiates to right testicle-denies injury-NAD-steady gait

## 2018-03-10 NOTE — ED Notes (Signed)
Patient left at this time with all belongings. 

## 2018-03-15 ENCOUNTER — Ambulatory Visit: Payer: Self-pay | Admitting: Surgery

## 2018-03-15 NOTE — H&P (Signed)
Devin Zimmerman Documented: 03/15/2018 10:10 AM Location: [facility] Patient #: [id number] DOB: 01/26/1990 Married / Language: [place] / Race: White Male  History of Present Illness Devin Fus A. Devin Hoge MD; 03/15/2018 10:37 AM) Patient words: Patient sent at the request of Devin Zimmerman the emergency room for a one-week history of sharp right groin pain. The patient noticed 1 week ago pain in his right groin described as sharp. He was noticed at the end of the day. Pain was made worse when he went to work to do lifting, pushing, climbing or pulling. He noticed a bulge also his right groin to go down when he would rest. He was evaluated the emergency room and found to have what was described as small right inguinal hernia. He has no nausea or vomiting. The pain is sharp location right groin made worse with lifting, pushing pulling them a better with rest. It is described as sharp and occasionally radiates to right testicle. He denies any left groin pain.  The patient is a 28 year old male.   Past Surgical History Devin Zimmerman, Devin Zimmerman; 03/15/2018 10:11 AM) Foot Surgery Left.  Allergies Devin Zimmerman, Devin Zimmerman; 03/15/2018 10:14 AM) Ceclor *CEPHALOSPORINS* Allergies Reconciled  Medication History Devin Zimmerman, Devin Zimmerman; 03/15/2018 10:14 AM) Amphetamine-Dextroamphetamine (10MG  Tablet, Oral) Active. HYDROcodone-Acetaminophen (5-325MG  Tablet, Oral) Active. guanFACINE HCl (1MG  Tablet, Oral) Active. risperiDONE (3MG  Tablet, Oral) Active. Medications Reconciled  Social History Devin Zimmerman, Devin Zimmerman; 03/15/2018 10:11 AM) Alcohol use Occasional alcohol use. Caffeine use Carbonated beverages, Coffee, Tea. Illicit drug use Prefer to discuss with provider. Tobacco use Current every day smoker.  Family History Devin Zimmerman, Devin Zimmerman; 03/15/2018 10:11 AM) Hypertension Family Members In General, Mother. Migraine Headache Mother.  Other Problems Devin Zimmerman, Devin Zimmerman; 03/15/2018 10:11 AM) Back  Pain     Review of Systems Devin Zimmerman Devin Zimmerman; 03/15/2018 10:11 AM) General Not Present- Appetite Loss, Chills, Fatigue, Fever, Night Sweats, Weight Gain and Weight Loss. Skin Not Present- Change in Wart/Mole, Dryness, Hives, Jaundice, New Lesions, Non-Healing Wounds, Rash and Ulcer. HEENT Not Present- Earache, Hearing Loss, Hoarseness, Nose Bleed, Oral Ulcers, Ringing in the Ears, Seasonal Allergies, Sinus Pain, Sore Throat, Visual Disturbances, Wears glasses/contact lenses and Yellow Eyes. Respiratory Present- Snoring. Not Present- Bloody sputum, Chronic Cough, Difficulty Breathing and Wheezing. Breast Not Present- Breast Mass, Breast Pain, Nipple Discharge and Skin Changes. Cardiovascular Not Present- Chest Pain, Difficulty Breathing Lying Down, Leg Cramps, Palpitations, Rapid Heart Rate, Shortness of Breath and Swelling of Extremities. Gastrointestinal Present- Abdominal Pain, Change in Bowel Habits, Constipation, Gets full quickly at meals, Nausea and Vomiting. Not Present- Bloating, Bloody Stool, Chronic diarrhea, Difficulty Swallowing, Excessive gas, Hemorrhoids, Indigestion and Rectal Pain. Male Genitourinary Not Present- Blood in Urine, Change in Urinary Stream, Frequency, Impotence, Nocturia, Painful Urination, Urgency and Urine Leakage. Musculoskeletal Not Present- Back Pain, Joint Pain, Joint Stiffness, Muscle Pain, Muscle Weakness and Swelling of Extremities. Neurological Not Present- Decreased Memory, Fainting, Headaches, Numbness, Seizures, Tingling, Tremor, Trouble walking and Weakness. Psychiatric Not Present- Anxiety, Bipolar, Change in Sleep Pattern, Depression, Fearful and Frequent crying. Endocrine Not Present- Cold Intolerance, Excessive Hunger, Hair Changes, Heat Intolerance, Hot flashes and New Diabetes. Hematology Not Present- Blood Thinners, Easy Bruising, Excessive bleeding, Gland problems, HIV and Persistent Infections.  Vitals Devin Zimmerman Devin Zimmerman; 03/15/2018 10:15  AM) 03/15/2018 10:14 AM Weight: 199.38 lb Height: 71in Body Surface Area: 2.11 m Body Mass Index: 27.81 kg/m  Temp.: 35F(Oral)  Pulse: 127 (Regular)  Resp.: 95 (Unlabored)  BP: 122/82 (Sitting, Left Arm, Standard)  Physical Exam (Devin Zimmerman A. Devin Vought MD; 03/15/2018 10:38 AM)  General Mental Status-Alert. General Appearance-Consistent with stated age. Hydration-Well hydrated. Voice-Normal.  Head and Neck Head-normocephalic, atraumatic with no lesions or palpable masses. Trachea-midline.  Eye Eyeball - Bilateral-Extraocular movements intact. Sclera/Conjunctiva - Bilateral-No scleral icterus.  Chest and Lung Exam Chest and lung exam reveals -quiet, even and easy respiratory effort with no use of accessory muscles and on auscultation, normal breath sounds, no adventitious sounds and normal vocal resonance. Inspection Chest Wall - Normal. Back - normal.  Cardiovascular Cardiovascular examination reveals -normal heart sounds, regular rate and rhythm with no murmurs and normal pedal pulses bilaterally.  Abdomen Note: Small reducible running or hernia. No evidence of left inguinal hernia. Penis and testicles are normal  Neurologic Neurologic evaluation reveals -alert and oriented x 3 with no impairment of recent or remote memory. Mental Status-Normal.  Musculoskeletal Normal Exam - Left-Upper Extremity Strength Normal and Lower Extremity Strength Normal. Normal Exam - Right-Upper Extremity Strength Normal, Lower Extremity Weakness.    Assessment & Plan (Devin Zimmerman A. Devin Bogan MD; 03/15/2018 10:39 AM)  RIGHT INGUINAL HERNIA (K40.90) Impression: The risk of hernia repair include bleeding, infection, organ injury, bowel injury, bladder injury, nerve injury recurrent hernia, blood clots, worsening of underlying condition, chronic pain, mesh use, open surgery, death, and the need for other operattions. Pt agrees to proceed. Discussed repair  of hernia. He is a smoker and strongly encouraged him to quit since his risk of complication and chronic pain are well over 10%.. Discussed laparoscopic and open repairs and the pros and cons of each technique as well as long-term expectations and complications of each. He is up for open repair.  Current Plans You are being scheduled for surgery- Our schedulers will call you.  You should hear from our office's scheduling department within 5 working days about the location, date, and time of surgery. We try to make accommodations for patient's preferences in scheduling surgery, but sometimes the OR schedule or the surgeon's schedule prevents [place] from making those accommodations.  If you have not heard from our office [phone]) in 5 working days, call the office and ask for your surgeon's nurse.  If you have other questions about your diagnosis, plan, or surgery, call the office and ask for your surgeon's nurse.  Pt Education - Pamphlet Given - Hernia Surgery: discussed with patient and provided information. The anatomy & physiology of the abdominal wall and pelvic floor was discussed. The pathophysiology of hernias in the inguinal and pelvic region was discussed. Natural history risks such as progressive enlargement, pain, incarceration, and strangulation was discussed. Contributors to complications such as smoking, obesity, diabetes, prior surgery, etc were discussed.  I feel the risks of no intervention will lead to serious problems that outweigh the operative risks; therefore, I recommended surgery to reduce and repair the hernia. I explained an open approach. I noted usual use of mesh to patch and/or buttress hernia repair  Risks such as bleeding, infection, abscess, need for further treatment, heart attack, death, and other risks were discussed. I noted a good likelihood this will help address the problem. Goals of post-operative recovery were discussed as well. Possibility that  this will not correct all symptoms was explained. I stressed the importance of low-impact activity, aggressive pain control, avoiding constipation, & not pushing through pain to minimize risk of post-operative chronic pain or injury. Possibility of reherniation was discussed. We will work to minimize complications.  An educational handout further explaining the pathology & treatment options  was given as well. Questions were answered. The patient expresses understanding & wishes to proceed with surgery.  Pt Education - CCS Mesh education: discussed with patient and provided information. Pt Education - Consent for inguinal hernia - [place]: discussed with patient and provided information. Pt Education - CCS STOP SMOKING!

## 2018-05-31 IMAGING — DX DG FINGER LITTLE 2+V*L*
3 series · 3 of 3 positions shown · non-contrast
Comparison: None

CLINICAL DATA: Left little finger crush injury yesterday. Pain and
swelling.

EXAM:
LEFT LITTLE FINGER 2+V

[finger pa]
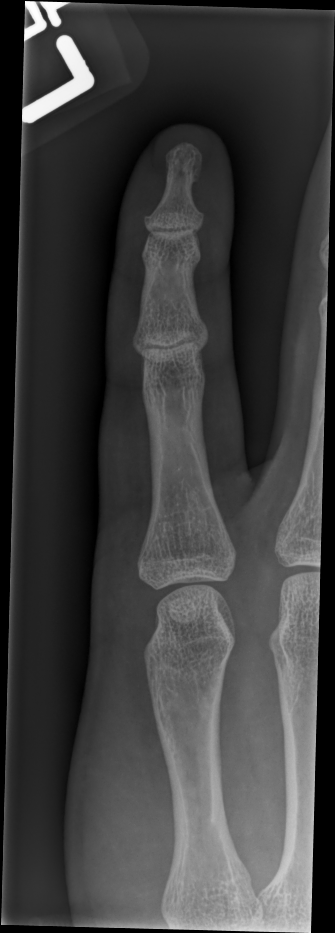

[finger oblique]
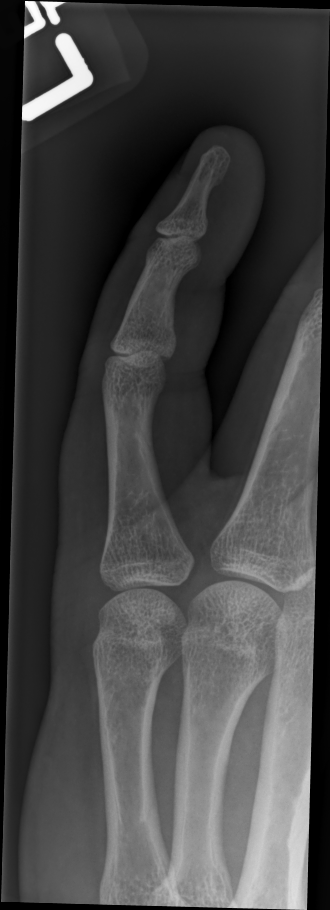

[finger lat]
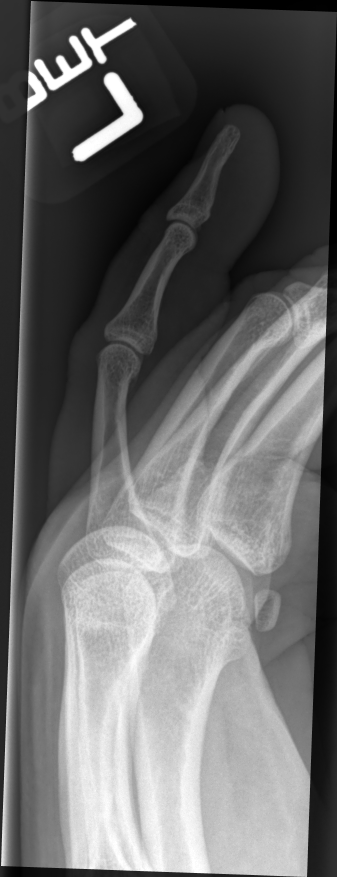

[3 of 3 positions shown; findings below may reference images not displayed]

FINDINGS: There is no evidence of fracture or dislocation. There is no
evidence of arthropathy or other focal bone abnormality. Soft
tissues are unremarkable.
IMPRESSION: Negative.

## 2018-10-22 IMAGING — CR DG HAND COMPLETE 3+V*R*
3 series · 3 of 3 positions shown · non-contrast
Comparison: 05/21/2015 .

CLINICAL DATA: Injury.

EXAM:
RIGHT HAND - COMPLETE 3+ VIEW

[x hand pa right]
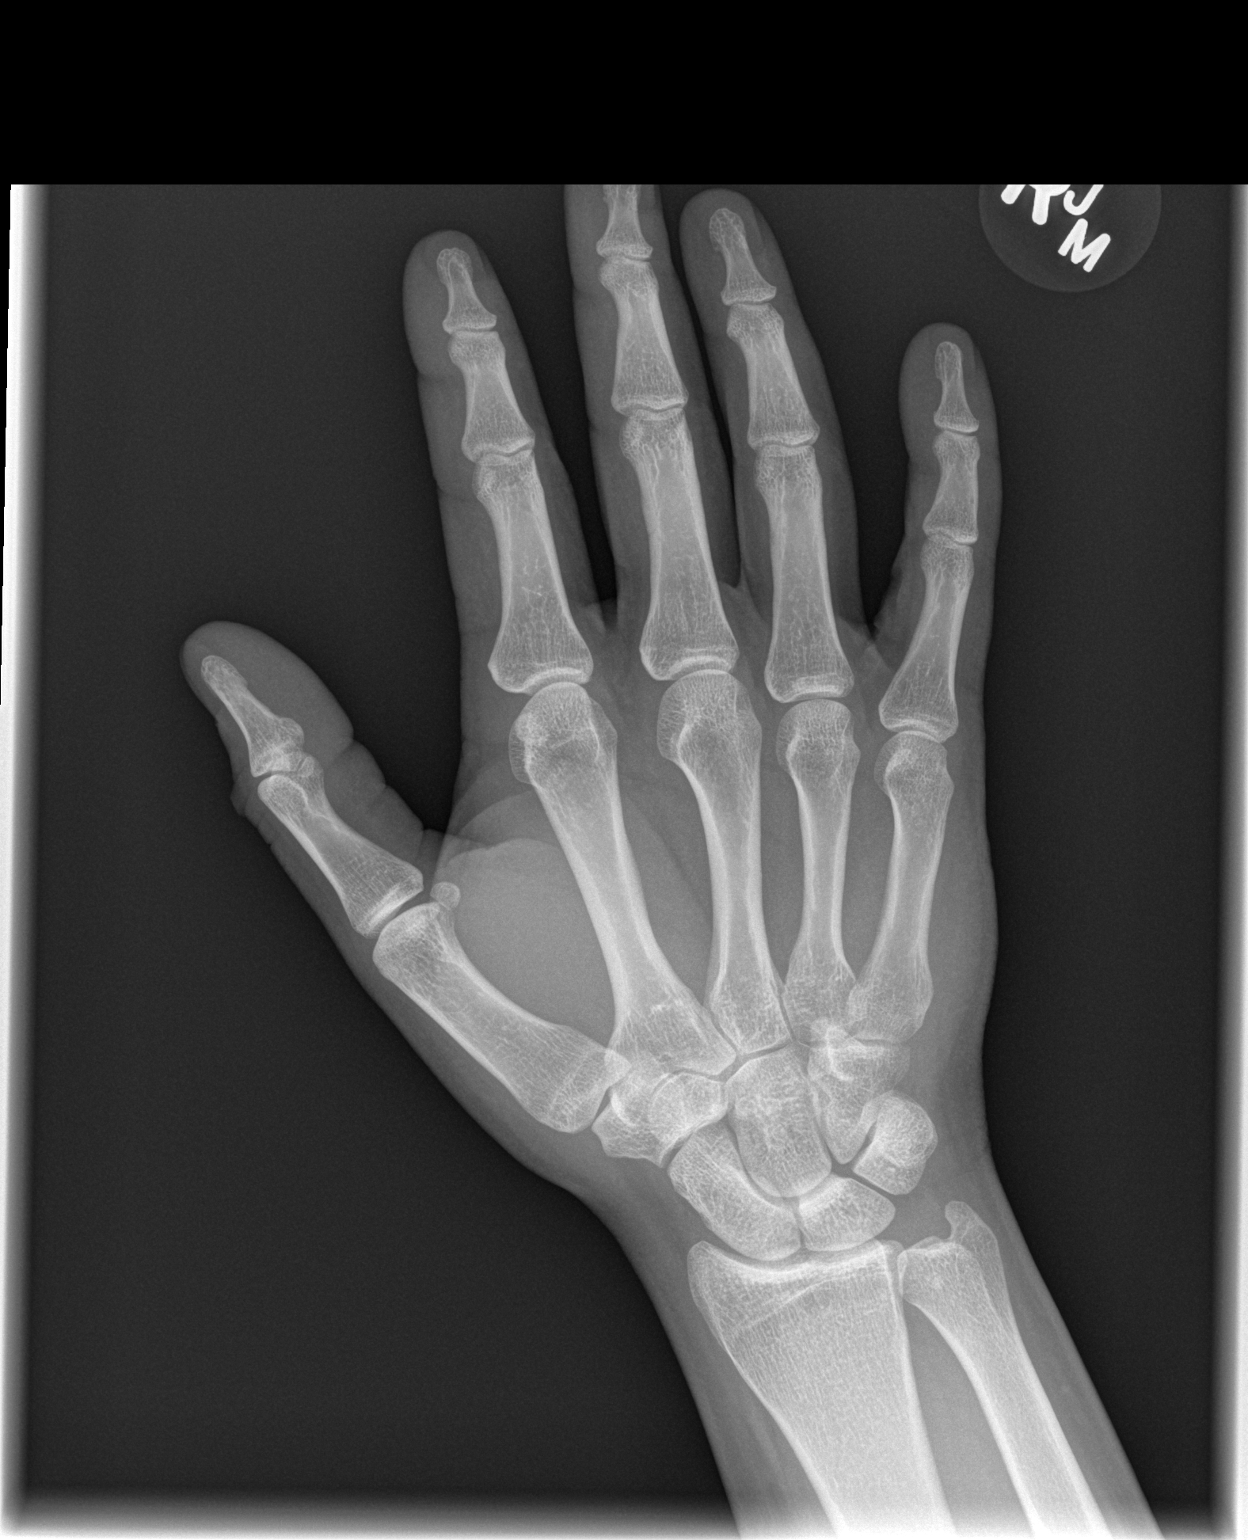

[x hand oblique right]
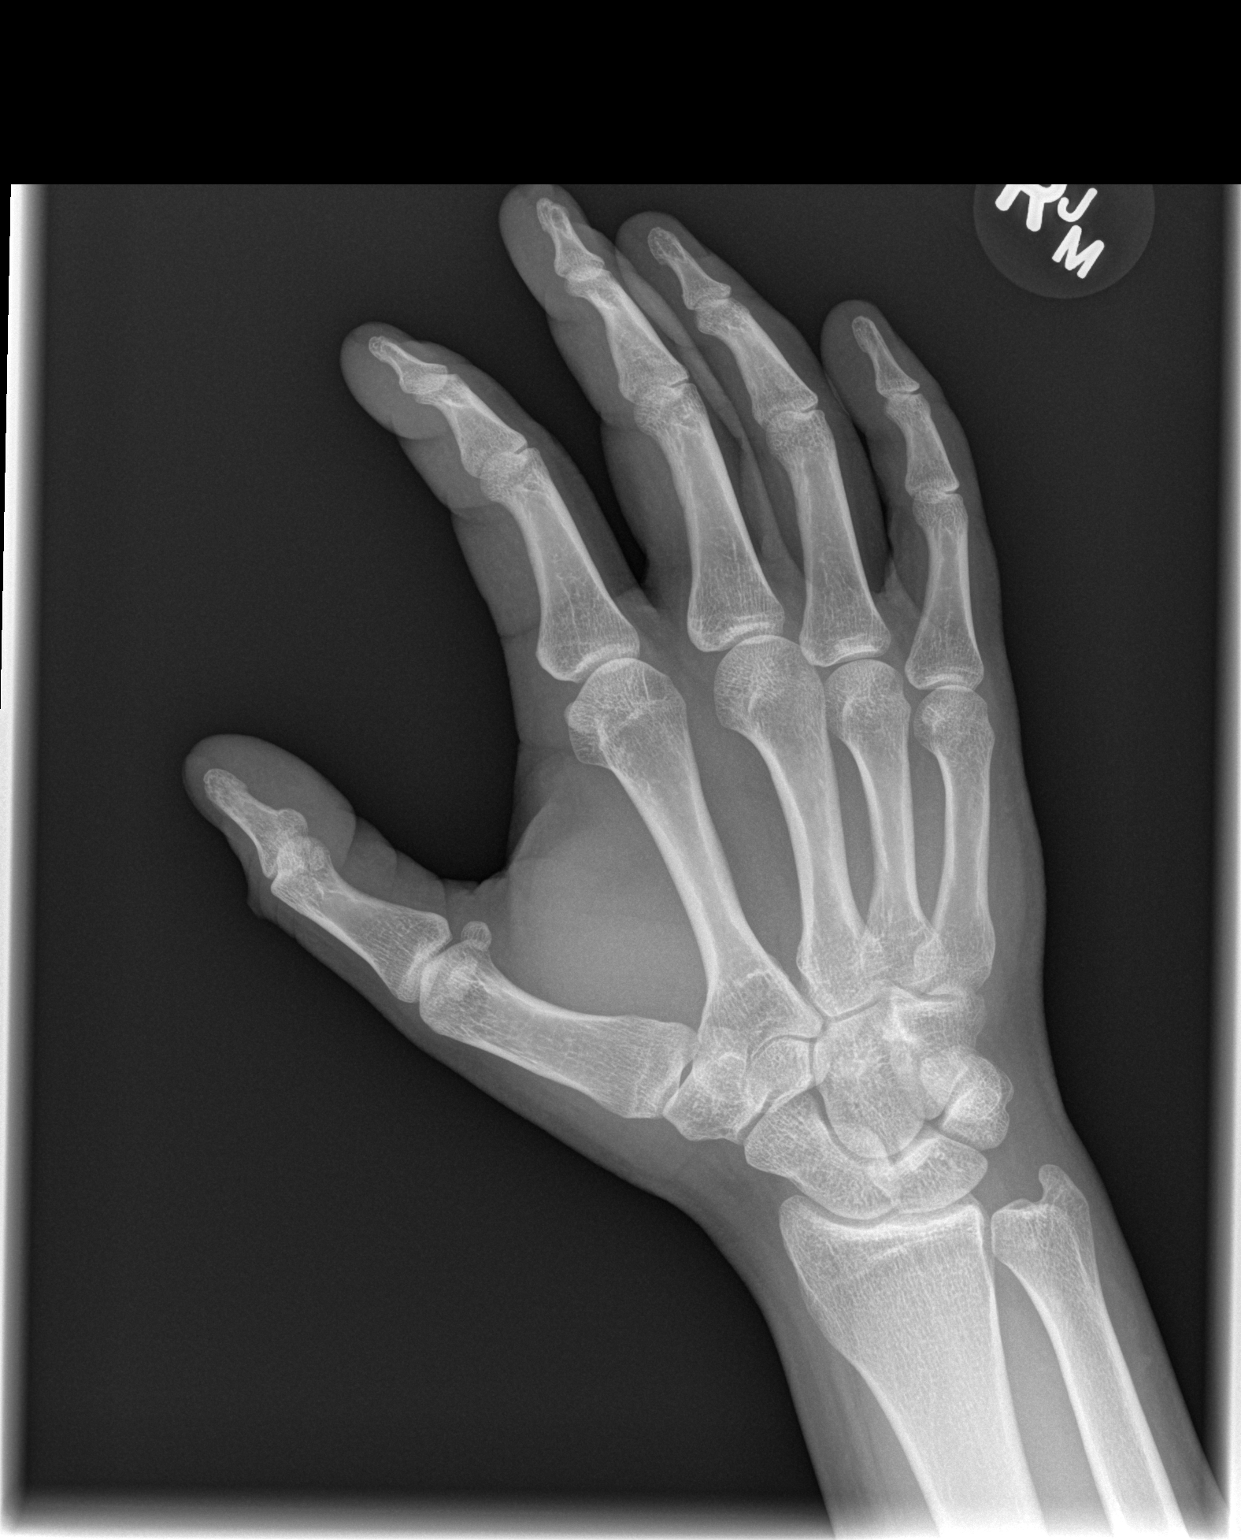

[x hand lat right]
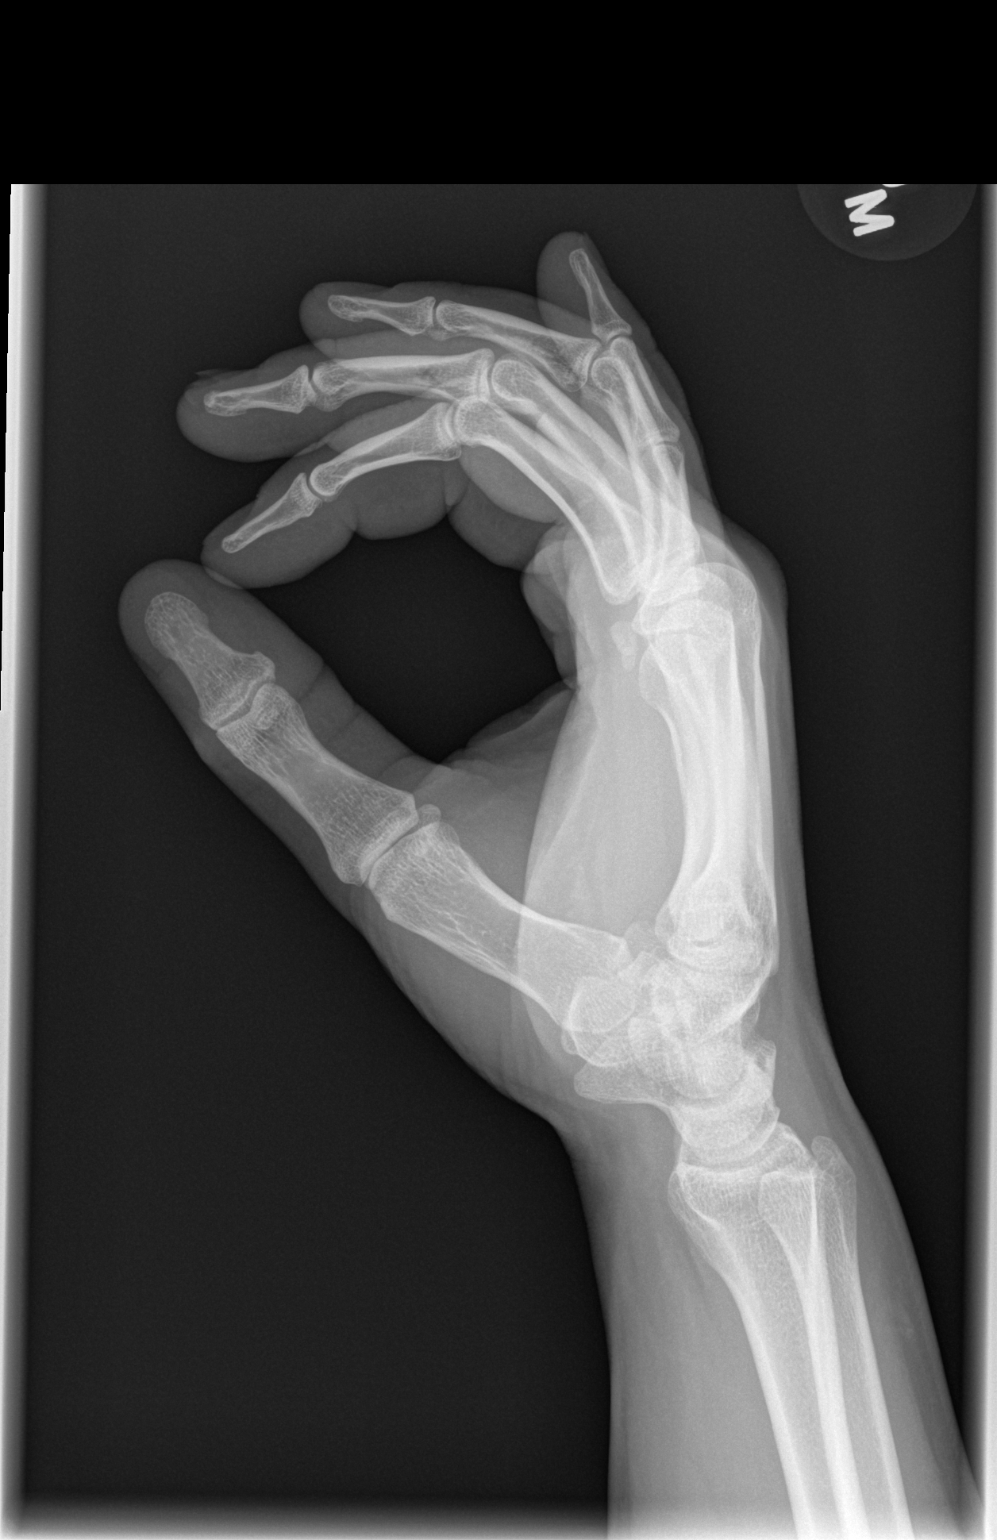

[3 of 3 positions shown; findings below may reference images not displayed]

FINDINGS: No acute bony or joint abnormality identified. No evidence of
fracture or dislocation.
IMPRESSION: No acute abnormality.

## 2018-10-30 ENCOUNTER — Emergency Department (HOSPITAL_BASED_OUTPATIENT_CLINIC_OR_DEPARTMENT_OTHER): Payer: Self-pay

## 2018-10-30 ENCOUNTER — Encounter (HOSPITAL_BASED_OUTPATIENT_CLINIC_OR_DEPARTMENT_OTHER): Payer: Self-pay | Admitting: Adult Health

## 2018-10-30 ENCOUNTER — Other Ambulatory Visit: Payer: Self-pay

## 2018-10-30 ENCOUNTER — Emergency Department (HOSPITAL_BASED_OUTPATIENT_CLINIC_OR_DEPARTMENT_OTHER)
Admission: EM | Admit: 2018-10-30 | Discharge: 2018-10-30 | Disposition: A | Discharge status: Home / Self Care | Payer: Self-pay | Attending: Emergency Medicine | Admitting: Emergency Medicine

## 2018-10-30 DIAGNOSIS — F17210 Nicotine dependence, cigarettes, uncomplicated: Secondary | ICD-10-CM | POA: Insufficient documentation

## 2018-10-30 DIAGNOSIS — J019 Acute sinusitis, unspecified: Secondary | ICD-10-CM | POA: Insufficient documentation

## 2018-10-30 MED ORDER — DIPHENHYDRAMINE HCL 50 MG/ML IJ SOLN
25.0000 mg | Freq: Once | INTRAMUSCULAR | Status: AC
  Administered 2018-10-30: 25 mg via INTRAVENOUS
  Filled 2018-10-30: qty 1

## 2018-10-30 MED ORDER — METOCLOPRAMIDE HCL 5 MG/ML IJ SOLN
10.0000 mg | Freq: Once | INTRAMUSCULAR | Status: AC
  Administered 2018-10-30: 10 mg via INTRAVENOUS
  Filled 2018-10-30: qty 2

## 2018-10-30 MED ORDER — DOXYCYCLINE HYCLATE 100 MG PO TABS
100.0000 mg | ORAL_TABLET | Freq: Once | ORAL | Status: AC
  Administered 2018-10-30: 100 mg via ORAL
  Filled 2018-10-30: qty 1

## 2018-10-30 MED ORDER — FLUTICASONE PROPIONATE 50 MCG/ACT NA SUSP: 1.0000 | Freq: Every day | NASAL | 0 refills | Status: DC

## 2018-10-30 MED ORDER — DOXYCYCLINE HYCLATE 100 MG PO CAPS: 100.0000 mg | ORAL_CAPSULE | Freq: Two times a day (BID) | ORAL | 0 refills | Status: AC

## 2018-10-30 MED ORDER — SODIUM CHLORIDE 0.9 % IV BOLUS
1000.0000 mL | Freq: Once | INTRAVENOUS | Status: AC
  Administered 2018-10-30: 1000 mL via INTRAVENOUS

## 2018-10-30 NOTE — ED Provider Notes (Addendum)
[facility] Provider Note   CSN: [id number] Arrival date & time: 10/30/18  2057     History   Chief Complaint Chief Complaint  Patient presents with   Nasal Congestion    HPI Devin Zimmerman is a 28 y.o. male Anxiety, depression who presents for evaluation of 3 to 5 days of nasal congestion, rhinorrhea, left ear pain, headache, facial pain.  He reports that he has had intermittent headache over the last 5 days.  He describes it as a pressure sensation and states it is mostly in the left side of his face.  He has not noted any fevers.  He states he has tried several medications without improvement in his headache.  He states he was seen by his primary care doctor on 10/07/18 for evaluation of similar headaches that he was having.  He was given some IM shots which he states did not really help.  He states that he is continued to have headaches and that his most recent symptoms have made it worse.  He reports he is also having cough states is productive of mucus.  No hemoptysis.  No chest pain, difficulty breathing.  He is a smoker and smokes half a pack of cigarettes a day.  He denies any vision changes, abdominal pain, nausea/vomiting.  He denies any recent travel, known COVID-19 exposure.     The history is provided by the patient.    Past Medical History:  Diagnosis Date   Anxiety and depression    Difficulty swallowing pills    Fibula fracture 10/2015   left   Finger injury 02/2016    Patient Active Problem List   Diagnosis Date Noted   Right wrist injury 05/21/2015    Past Surgical History:  Procedure Laterality Date   ORIF FIBULA FRACTURE Left 11/08/2015   Procedure: OPEN REDUCTION INTERNAL FIXATION (ORIF) FIBULA FRACTURE, left;  Surgeon: Ninetta Lights, MD;  Location: [facility];  Service: Orthopedics;  Laterality: Left;  Block        Home Medications    Prior to Admission medications   Medication Sig Start  Date End Date Taking? Authorizing Provider  doxycycline (VIBRAMYCIN) 100 MG capsule Take 1 capsule (100 mg total) by mouth 2 (two) times daily for 7 days. 10/30/18 11/06/18  Volanda Napoleon, PA-C  fluticasone (FLONASE) 50 MCG/ACT nasal spray Place 1 spray into both nostrils daily. 10/30/18   Volanda Napoleon, PA-C  HYDROcodone-acetaminophen (NORCO) 5-325 MG tablet Take 1-2 tablets by mouth every 6 (six) hours as needed. 03/10/18   Veryl Speak, MD    Family History History reviewed. No pertinent family history.  Social History Social History   Tobacco Use   Smoking status: Current Every Day Smoker    Packs/day: 0.50    Years: 5.00    Pack years: 2.50    Types: Cigarettes   Smokeless tobacco: Never Used  Substance Use Topics   Alcohol use: Yes    Comment: occasionally   Drug use: No     Allergies   Ceclor [cefaclor]   Review of Systems Review of Systems  Constitutional: Negative for fever.  HENT: Positive for congestion, ear pain and rhinorrhea.   Respiratory: Positive for cough. Negative for shortness of breath.   Cardiovascular: Negative for chest pain.  Gastrointestinal: Negative for abdominal pain, nausea and vomiting.  Genitourinary: Negative for dysuria and hematuria.  Neurological: Positive for numbness and headaches. Negative for weakness.  All other systems reviewed and  are negative.    Physical Exam Updated Vital Signs BP 128/88 (BP Location: Left Arm)    Pulse 92    Temp 99.9 F (37.7 C) (Oral)    Resp 20    Ht 5\' 11"  (1.803 m)    Wt 90.1 kg    SpO2 97%    BMI 27.70 kg/m   Physical Exam Vitals signs and nursing note reviewed.  Constitutional:      Appearance: Normal appearance. He is well-developed.  HENT:     Head: Normocephalic and atraumatic.     Right Ear: Tympanic membrane normal. No mastoid tenderness.     Left Ear: Tympanic membrane normal. No mastoid tenderness.     Ears:     Comments: Slight erythema noted to the surrounding external  auditory canal bilaterally. No signs of effusion or bulging of TM bilaterally. No warmth, tenderness noted to mastoid process bilaterally.     Nose:     Right Turbinates: Enlarged and swollen.     Left Turbinates: Enlarged and swollen.     Left Sinus: Maxillary sinus tenderness and frontal sinus tenderness present.     Comments: Edematous, erythematous nasal turbinates bilaterally.  Patient with frontal and maxillary sinus tenderness. Eyes:     General: Lids are normal.     Conjunctiva/sclera: Conjunctivae normal.     Pupils: Pupils are equal, round, and reactive to light.     Comments: PERRL. EOMs intact. No nystagmus. No neglect.   Neck:     Musculoskeletal: Full passive range of motion without pain and neck supple.     Comments: Neck is supple and without rigidity. FROM without difficulty.  Cardiovascular:     Rate and Rhythm: Normal rate and regular rhythm.     Pulses: Normal pulses.     Heart sounds: Normal heart sounds. No murmur. No friction rub. No gallop.   Pulmonary:     Effort: Pulmonary effort is normal.     Breath sounds: Normal breath sounds.     Comments: Lungs clear to auscultation bilaterally.  Symmetric chest rise.  No wheezing, rales, rhonchi. Abdominal:     Palpations: Abdomen is soft. Abdomen is not rigid.     Tenderness: There is no abdominal tenderness. There is no guarding.     Comments: Abdomen is soft, non-distended, non-tender. No rigidity, No guarding. No peritoneal signs.  Musculoskeletal: Normal range of motion.  Skin:    General: Skin is warm and dry.     Capillary Refill: Capillary refill takes less than 2 seconds.  Neurological:     Mental Status: He is alert and oriented to person, place, and time.     Comments: Cranial nerves III-XII intact Follows commands, Moves all extremities  5/5 strength to BUE and BLE  Sensation intact throughout all major nerve distributions  No slurred speech. No facial droop.   Psychiatric:        Speech: Speech  normal.      ED Treatments / Results  Labs (all labs ordered are listed, but only abnormal results are displayed) Labs Reviewed - No data to display  EKG None  Radiology No results found.  Procedures Procedures (including critical care time)  Medications Ordered in ED Medications  doxycycline (VIBRA-TABS) tablet 100 mg (has no administration in time range)  sodium chloride 0.9 % bolus 1,000 mL (1,000 mLs Intravenous New Bag/Given 10/30/18 2205)  metoCLOPramide (REGLAN) injection 10 mg (10 mg Intravenous Given 10/30/18 2207)  diphenhydrAMINE (BENADRYL) injection 25 mg (25 mg Intravenous  Given 10/30/18 2206)     Initial Impression / Assessment and Plan / ED Course  I have reviewed the triage vital signs and the nursing notes.  Pertinent labs & imaging results that were available during my care of the patient were reviewed by me and considered in my medical decision making (see chart for details).        28 year old male who presents for evaluation of 3 to 5 days of nasal congestion, rhinorrhea.  Also reports headache.  States the headache has been an intermittent issue that he has been dealing with since September 2020.  Seen by PCP given IM medications help.  No history of migraines.  He states he does not getting frequent headaches.  No fevers.  No known COVID-19 exposure. Patient is afebrile, non-toxic appearing, sitting comfortably on examination table. Vital signs reviewed and stable.  No neuro deficits on exam.  He does have some erythematous, edematous nasal turbinates bilaterally.  He has tenderness palpation of the frontal and maxillary sinuses on the left side.  Neck is soft and without any rigidity.  Consider sinusitis versus tension headache versus migraine headache.  Consider viral process.  Doubt pneumonia.  History/physical exam not concerning for meningitis.  Given persistent history of headaches with no headache history, will plan for CT head.  Additionally, will  get chest x-ray for evaluation of any infectious process.  We will give migraine cocktail and reassess.  Nurse informed me that patient did not want CT head and chest x-ray.  I discussed at length with patient engaged in shared decision making.  I discussed risk first benefits of declining head CT and chest x-ray.  Patient wishes not to have any imaging at this time.  Given his reassuring exam, I feel it is reasonable.  He reports feeling better after migraine cocktail and is requesting to leave.  We will plan to treat as sinusitis.  He is allergic to Ceclor.  We will start him on a course of doxycycline and Flonase for symptomatic relief.  I offered outpatient COVID-19 testing but patient denied. At this time, patient exhibits no emergent life-threatening condition that require further evaluation in ED or admission. Patient had ample opportunity for questions and discussion. All patient's questions were answered with full understanding. Strict return precautions discussed. Patient expresses understanding and agreement to plan.   Jamicheal Thomes LollingJ Bares was evaluated in Emergency Department on 10/30/2018 for the symptoms described in the history of present illness. He was evaluated in the context of the global COVID-19 pandemic, which necessitated consideration that the patient might be at risk for infection with the SARS-CoV-2 virus that causes COVID-19. Institutional protocols and algorithms that pertain to the evaluation of patients at risk for COVID-19 are in a state of rapid change based on information released by regulatory bodies including the CDC and federal and state organizations. These policies and algorithms were followed during the patient's care in the ED.  Portions of this note were generated with Scientist, clinical (histocompatibility and immunogenetics)Dragon dictation software. Dictation errors may occur despite best attempts at proofreading.  Final Clinical Impressions(s) / ED Diagnoses   Final diagnoses:  Acute non-recurrent sinusitis, unspecified  location    ED Discharge Orders         Ordered    doxycycline (VIBRAMYCIN) 100 MG capsule  2 times daily     10/30/18 2246    fluticasone (FLONASE) 50 MCG/ACT nasal spray  Daily     10/30/18 2246  Maxwell Caul, PA-C 10/30/18 2255    Maxwell Caul, PA-C 10/30/18 2302    Charlynne Pander, MD 10/30/18 2312

## 2018-10-30 NOTE — ED Notes (Signed)
Pt declined CT scan and Xray. Evette Cristal, EDPA made aware

## 2018-10-30 NOTE — Discharge Instructions (Signed)
You can take Tylenol or Ibuprofen as directed for pain. You can alternate Tylenol and Ibuprofen every 4 hours. If you take Tylenol at 1pm, then you can take Ibuprofen at 5pm. Then you can take Tylenol again at 9pm.   Take flonase as directed.   Follow-up with your primary care doctor.  Return the emergency department for any fever, numbness/weakness of arms or legs, vomiting, difficulty breathing or any other worsening or concerning symptoms.

## 2018-10-30 NOTE — ED Triage Notes (Signed)
Presents with 3-5 days of nasal congestion, facial pain, cough and left ear pain. He denies fevers. Taking mucinex with no relief.

## 2019-10-24 ENCOUNTER — Encounter (HOSPITAL_BASED_OUTPATIENT_CLINIC_OR_DEPARTMENT_OTHER): Payer: Self-pay | Admitting: *Deleted

## 2019-10-24 ENCOUNTER — Other Ambulatory Visit: Payer: Self-pay

## 2019-10-24 ENCOUNTER — Emergency Department (HOSPITAL_BASED_OUTPATIENT_CLINIC_OR_DEPARTMENT_OTHER): Payer: 59

## 2019-10-24 ENCOUNTER — Emergency Department (HOSPITAL_BASED_OUTPATIENT_CLINIC_OR_DEPARTMENT_OTHER)
Admission: EM | Admit: 2019-10-24 | Discharge: 2019-10-25 | Disposition: A | Discharge status: Home / Self Care | Payer: 59 | Attending: Emergency Medicine | Admitting: Emergency Medicine

## 2019-10-24 DIAGNOSIS — X58XXXA Exposure to other specified factors, initial encounter: Secondary | ICD-10-CM | POA: Diagnosis not present

## 2019-10-24 DIAGNOSIS — Z23 Encounter for immunization: Secondary | ICD-10-CM | POA: Diagnosis not present

## 2019-10-24 DIAGNOSIS — F17210 Nicotine dependence, cigarettes, uncomplicated: Secondary | ICD-10-CM | POA: Insufficient documentation

## 2019-10-24 DIAGNOSIS — L089 Local infection of the skin and subcutaneous tissue, unspecified: Secondary | ICD-10-CM

## 2019-10-24 DIAGNOSIS — S61213A Laceration without foreign body of left middle finger without damage to nail, initial encounter: Secondary | ICD-10-CM | POA: Diagnosis present

## 2019-10-24 DIAGNOSIS — T148XXA Other injury of unspecified body region, initial encounter: Secondary | ICD-10-CM

## 2019-10-24 MED ORDER — CLINDAMYCIN PHOSPHATE 600 MG/50ML IV SOLN
600.0000 mg | Freq: Once | INTRAVENOUS | Status: AC
  Administered 2019-10-25: 600 mg via INTRAVENOUS
  Filled 2019-10-24: qty 50

## 2019-10-24 MED ORDER — DIPHENHYDRAMINE HCL 50 MG/ML IJ SOLN
25.0000 mg | Freq: Once | INTRAMUSCULAR | Status: AC
  Administered 2019-10-25: 25 mg via INTRAVENOUS
  Filled 2019-10-24: qty 1

## 2019-10-24 MED ORDER — KETOROLAC TROMETHAMINE 30 MG/ML IJ SOLN
30.0000 mg | Freq: Once | INTRAMUSCULAR | Status: AC
  Administered 2019-10-25: 30 mg via INTRAVENOUS
  Filled 2019-10-24: qty 1

## 2019-10-24 MED ORDER — TETANUS-DIPHTH-ACELL PERTUSSIS 5-2.5-18.5 LF-MCG/0.5 IM SUSP
0.5000 mL | Freq: Once | INTRAMUSCULAR | Status: AC
  Administered 2019-10-25: 0.5 mL via INTRAMUSCULAR
  Filled 2019-10-24: qty 0.5

## 2019-10-24 NOTE — ED Triage Notes (Signed)
C/o wound left hand 3rd finger x 1 week ago , c/o increased redness and pain

## 2019-10-25 ENCOUNTER — Encounter (HOSPITAL_BASED_OUTPATIENT_CLINIC_OR_DEPARTMENT_OTHER): Payer: Self-pay | Admitting: Emergency Medicine

## 2019-10-25 LAB — CBC WITH DIFFERENTIAL/PLATELET
Abs Immature Granulocytes: 0.02 10*3/uL (ref 0.00–0.07)
Basophils Absolute: 0.1 10*3/uL (ref 0.0–0.1)
Basophils Relative: 1 %
Eosinophils Absolute: 0.2 10*3/uL (ref 0.0–0.5)
Eosinophils Relative: 3 %
HCT: 42.5 % (ref 39.0–52.0)
Hemoglobin: 14.1 g/dL (ref 13.0–17.0)
Immature Granulocytes: 0 %
Lymphocytes Relative: 31 %
Lymphs Abs: 2.7 10*3/uL (ref 0.7–4.0)
MCH: 28.9 pg (ref 26.0–34.0)
MCHC: 33.2 g/dL (ref 30.0–36.0)
MCV: 87.1 fL (ref 80.0–100.0)
Monocytes Absolute: 0.7 10*3/uL (ref 0.1–1.0)
Monocytes Relative: 8 %
Neutro Abs: 5 10*3/uL (ref 1.7–7.7)
Neutrophils Relative %: 57 %
Platelets: 194 10*3/uL (ref 150–400)
RBC: 4.88 MIL/uL (ref 4.22–5.81)
RDW: 12.8 % (ref 11.5–15.5)
WBC: 8.7 10*3/uL (ref 4.0–10.5)
nRBC: 0 % (ref 0.0–0.2)

## 2019-10-25 LAB — BASIC METABOLIC PANEL
Anion gap: 10 (ref 5–15)
BUN: 12 mg/dL (ref 6–20)
CO2: 27 mmol/L (ref 22–32)
Calcium: 9.2 mg/dL (ref 8.9–10.3)
Chloride: 102 mmol/L (ref 98–111)
Creatinine, Ser: 1.03 mg/dL (ref 0.61–1.24)
GFR, Estimated: >60 mL/min (ref >60)
Glucose, Bld: 103 mg/dL — ABNORMAL HIGH (ref 70–99)
Potassium: 3.7 mmol/L (ref 3.5–5.1)
Sodium: 139 mmol/L (ref 135–145)

## 2019-10-25 MED ORDER — TETANUS-DIPHTH-ACELL PERTUSSIS 5-2.5-18.5 LF-MCG/0.5 IM SUSP
INTRAMUSCULAR | Status: AC
  Filled 2019-10-25: qty 0.5

## 2019-10-25 MED ORDER — IBUPROFEN 800 MG PO TABS: 800.0000 mg | ORAL_TABLET | Freq: Three times a day (TID) | ORAL | 0 refills | Status: DC

## 2019-10-25 MED ORDER — DOXYCYCLINE HYCLATE 100 MG PO CAPS: 100.0000 mg | ORAL_CAPSULE | Freq: Two times a day (BID) | ORAL | 0 refills | Status: DC

## 2019-10-25 NOTE — ED Provider Notes (Signed)
[facility] EMERGENCY DEPARTMENT Provider Note   CSN: [id number] Arrival date & time: 10/24/19  2141     History Chief Complaint  Patient presents with  . Wound Infection    Devin Zimmerman is a 29 y.o. male.  The history is provided by the patient.  Hand Pain This is a new problem. The current episode started more than 1 week ago. The problem occurs constantly. The problem has not changed since onset.Pertinent negatives include no chest pain, no abdominal pain, no headaches and no shortness of breath. Nothing aggravates the symptoms. Nothing relieves the symptoms. Treatments tried: peroxide. The treatment provided no relief.  Patient had a cut over his left long finger PIP joint 8 days ago and has had pain and swelling since that time. Reports swelling at that joint and pain.  No f/c/r.  No streaking. No swelling of the rest of the finger nor of the hand.      Past Medical History:  Diagnosis Date  . Anxiety and depression   . Difficulty swallowing pills   . Fibula fracture 10/2015   left  . Finger injury 02/2016    Patient Active Problem List   Diagnosis Date Noted  . Right wrist injury 05/21/2015    Past Surgical History:  Procedure Laterality Date  . ORIF FIBULA FRACTURE Left 11/08/2015   Procedure: OPEN REDUCTION INTERNAL FIXATION (ORIF) FIBULA FRACTURE, left;  Surgeon: Loreta Aveaniel F Murphy, MD;  Location: [facility];  Service: Orthopedics;  Laterality: Left;  Block       History reviewed. No pertinent family history.  Social History   Tobacco Use  . Smoking status: Current Every Day Smoker    Packs/day: 0.50    Years: 5.00    Pack years: 2.50    Types: Cigarettes  . Smokeless tobacco: Current User  Vaping Use  . Vaping Use: Every day  Substance Use Topics  . Alcohol use: Yes    Comment: occasionally  . Drug use: No    Home Medications Prior to Admission medications   Medication Sig Start Date End Date Taking? Authorizing  Provider  fluticasone (FLONASE) 50 MCG/ACT nasal spray Place 1 spray into both nostrils daily. 10/30/18   Maxwell CaulLayden, Lindsey A, PA-C  HYDROcodone-acetaminophen (NORCO) 5-325 MG tablet Take 1-2 tablets by mouth every 6 (six) hours as needed. 03/10/18   Geoffery Lyonselo, Douglas, MD    Allergies    Ceclor [cefaclor]  Review of Systems   Review of Systems  Constitutional: Negative for fever.  HENT: Negative for congestion.   Eyes: Negative for visual disturbance.  Respiratory: Negative for shortness of breath.   Cardiovascular: Negative for chest pain.  Gastrointestinal: Negative for abdominal pain.  Genitourinary: Negative for difficulty urinating.  Musculoskeletal: Positive for arthralgias and joint swelling.  Skin: Negative for color change.  Neurological: Negative for headaches.  Psychiatric/Behavioral: Negative for agitation.  All other systems reviewed and are negative.   Physical Exam Updated Vital Signs BP 122/85   Pulse 85   Temp 98.1 F (36.7 C) (Oral)   Resp 16   Ht 5\' 11"  (1.803 m)   Wt 77.1 kg   SpO2 100%   BMI 23.71 kg/m   Physical Exam Vitals and nursing note reviewed.  Constitutional:      General: He is not in acute distress.    Appearance: Normal appearance.  HENT:     Head: Normocephalic and atraumatic.     Nose: Nose normal.  Eyes:  Conjunctiva/sclera: Conjunctivae normal.     Pupils: Pupils are equal, round, and reactive to light.  Cardiovascular:     Rate and Rhythm: Normal rate and regular rhythm.     Pulses: Normal pulses.     Heart sounds: Normal heart sounds.  Pulmonary:     Effort: Pulmonary effort is normal.     Breath sounds: Normal breath sounds.  Abdominal:     General: Abdomen is flat. Bowel sounds are normal.     Palpations: Abdomen is soft.     Tenderness: There is no abdominal tenderness. There is no guarding.  Musculoskeletal:        General: Normal range of motion.     Right hand: Normal.     Left hand: No deformity or lacerations.  Normal range of motion. Normal strength. Normal sensation. There is no disruption of two-point discrimination. Normal capillary refill. Normal pulse.       Arms:     Cervical back: Normal range of motion and neck supple.     Comments: No lymphangitis.  No Kanavel signs  Skin:    General: Skin is warm and dry.     Capillary Refill: Capillary refill takes less than 2 seconds.     Coloration: Skin is not pale.     Findings: No erythema.  Neurological:     General: No focal deficit present.     Mental Status: He is alert and oriented to person, place, and time.     Deep Tendon Reflexes: Reflexes normal.  Psychiatric:        Mood and Affect: Mood normal.        Behavior: Behavior normal.     ED Results / Procedures / Treatments   Labs (all labs ordered are listed, but only abnormal results are displayed) Results for orders placed or performed during the hospital encounter of 10/24/19  CBC with Differential/Platelet  Result Value Ref Range   WBC 8.7 4.0 - 10.5 K/uL   RBC 4.88 4.22 - 5.81 MIL/uL   Hemoglobin 14.1 13.0 - 17.0 g/dL   HCT 61.4 39 - 52 %   MCV 87.1 80.0 - 100.0 fL   MCH 28.9 26.0 - 34.0 pg   MCHC 33.2 30.0 - 36.0 g/dL   RDW 43.1 54.0 - 08.6 %   Platelets 194 150 - 400 K/uL   nRBC 0.0 0.0 - 0.2 %   Neutrophils Relative % 57 %   Neutro Abs 5.0 1.7 - 7.7 K/uL   Lymphocytes Relative 31 %   Lymphs Abs 2.7 0.7 - 4.0 K/uL   Monocytes Relative 8 %   Monocytes Absolute 0.7 0.1 - 1.0 K/uL   Eosinophils Relative 3 %   Eosinophils Absolute 0.2 0 - 0 K/uL   Basophils Relative 1 %   Basophils Absolute 0.1 0 - 0 K/uL   Immature Granulocytes 0 %   Abs Immature Granulocytes 0.02 0.00 - 0.07 K/uL  Basic metabolic panel  Result Value Ref Range   Sodium 139 135 - 145 mmol/L   Potassium 3.7 3.5 - 5.1 mmol/L   Chloride 102 98 - 111 mmol/L   CO2 27 22 - 32 mmol/L   Glucose, Bld 103 (H) 70 - 99 mg/dL   BUN 12 6 - 20 mg/dL   Creatinine, Ser 7.61 0.61 - 1.24 mg/dL   Calcium 9.2  8.9 - 95.0 mg/dL   GFR, Estimated >93 >26 mL/min   Anion gap 10 5 - 15   DG Hand Complete Left  Result Date: 10/24/2019 CLINICAL DATA:  Third digit wound, increasing redness and pain EXAM: LEFT HAND - COMPLETE 3+ VIEW COMPARISON:  None. FINDINGS: Mild soft tissue swelling of the third digit. No soft tissue gas or foreign body. No osseous destruction, cortical erosion or other features of osteomyelitis. No acute bony abnormality. Specifically, no fracture, subluxation, or dislocation. No other significant soft tissue abnormalities. IMPRESSION: Mild soft tissue swelling of the third digit. No radiographic evidence of osteomyelitis or other acute osseous abnormality. No soft tissue gas or foreign body. Electronically Signed   By: Kreg Shropshire M.D.   On: 10/24/2019 23:31    Radiology DG Hand Complete Left  Result Date: 10/24/2019 CLINICAL DATA:  Third digit wound, increasing redness and pain EXAM: LEFT HAND - COMPLETE 3+ VIEW COMPARISON:  None. FINDINGS: Mild soft tissue swelling of the third digit. No soft tissue gas or foreign body. No osseous destruction, cortical erosion or other features of osteomyelitis. No acute bony abnormality. Specifically, no fracture, subluxation, or dislocation. No other significant soft tissue abnormalities. IMPRESSION: Mild soft tissue swelling of the third digit. No radiographic evidence of osteomyelitis or other acute osseous abnormality. No soft tissue gas or foreign body. Electronically Signed   By: Kreg Shropshire M.D.   On: 10/24/2019 23:31    Procedures Procedures (including critical care time)  Medications Ordered in ED Medications  clindamycin (CLEOCIN) IVPB 600 mg (0 mg Intravenous Stopped 10/25/19 0039)  Tdap (BOOSTRIX) injection 0.5 mL (0.5 mLs Intramuscular Given 10/25/19 0018)  diphenhydrAMINE (BENADRYL) injection 25 mg (25 mg Intravenous Given 10/25/19 0005)  ketorolac (TORADOL) 30 MG/ML injection 30 mg (30 mg Intravenous Given 10/25/19 0006)    ED  Course  I have reviewed the triage vital signs and the nursing notes.  Pertinent labs & imaging results that were available during my care of the patient were reviewed by me and considered in my medical decision making (see chart for details).   There are no signs of Flexor tenosynovitis at this time.  No Kanavel signs.  Patient is treated for infection in the ED and will be started on doxycycline.  Will place in splint to protect finger. Take all antibiotics and follow up with hand surgery for ongoing care.  Strict return precautions given.    Lugene VITTORIO MOHS was evaluated in Emergency Department on 10/25/2019 for the symptoms described in the history of present illness. He was evaluated in the context of the global COVID-19 pandemic, which necessitated consideration that the patient might be at risk for infection with the SARS-CoV-2 virus that causes COVID-19. Institutional protocols and algorithms that pertain to the evaluation of patients at risk for COVID-19 are in a state of rapid change based on information released by regulatory bodies including the CDC and federal and state organizations. These policies and algorithms were followed during the patient's care in the ED.  Final Clinical Impression(s) / ED Diagnoses  Return for intractable cough, coughing up blood,fevers >100.4 unrelieved by medication, shortness of breath, intractable vomiting, chest pain, shortness of breath, weakness,numbness, changes in speech, facial asymmetry,abdominal pain, passing out,Inability to tolerate liquids or food, cough, altered mental status or any concerns. No signs of systemic illness or infection. The patient is nontoxic-appearing on exam and vital signs are within normal limits.   I have reviewed the triage vital signs and the nursing notes. Pertinent labs &imaging results that were available during my care of the patient were reviewed by me and considered in my medical decision making (see chart  for details).After history, exam, and medical workup I feel the patient has beenappropriately medically screened and is safe for discharge home. Pertinent diagnoses were discussed with the patient. Patient was given return precautions.     Stepfanie Yott, MD 10/25/19 [phone]

## 2020-05-11 DIAGNOSIS — G43109 Migraine with aura, not intractable, without status migrainosus: Secondary | ICD-10-CM | POA: Insufficient documentation

## 2020-07-13 DIAGNOSIS — E781 Pure hyperglyceridemia: Secondary | ICD-10-CM

## 2020-07-13 HISTORY — DX: Pure hyperglyceridemia: E78.1

## 2020-08-03 LAB — CBC AND DIFFERENTIAL
HCT: 46 (ref 41–53)
Hemoglobin: 15.8 (ref 13.5–17.5)
Neutrophils Absolute: 5.5
Platelets: 265 (ref 150–399)
WBC: 9.5

## 2020-08-03 LAB — BASIC METABOLIC PANEL
BUN: 12 (ref 4–21)
CO2: 21 (ref 13–22)
Chloride: 103 (ref 99–108)
Creatinine: 0.9 (ref 0.6–1.3)
Glucose: 99
Potassium: 4.2 (ref 3.4–5.3)
Sodium: 144 (ref 137–147)

## 2020-08-03 LAB — COMPREHENSIVE METABOLIC PANEL
Albumin: 4.7 (ref 3.5–5.0)
Calcium: 9.7 (ref 8.7–10.7)
GFR calc non Af Amer: 114
Globulin: 2.1

## 2020-08-03 LAB — LIPID PANEL
Cholesterol: 164 (ref 0–200)
HDL: 38 (ref 35–70)
LDL Cholesterol: 66
LDl/HDL Ratio: 1.7
Triglycerides: 385 — AB (ref 40–160)

## 2020-08-03 LAB — TSH: TSH: 0.99 (ref 0.41–5.90)

## 2020-08-03 LAB — HEPATIC FUNCTION PANEL
ALT: 18 (ref 3–30)
AST: 23 (ref 2–40)
Alkaline Phosphatase: 83 (ref 25–125)
Bilirubin, Total: 0.4

## 2020-08-03 LAB — CBC: RBC: 5.39 — AB (ref 3.87–5.11)

## 2021-02-19 ENCOUNTER — Encounter: Payer: Self-pay | Admitting: Family Medicine

## 2021-03-07 ENCOUNTER — Encounter: Payer: Self-pay | Admitting: Family Medicine

## 2021-03-07 NOTE — Progress Notes (Signed)
Office Note 03/08/2021  CC:  Chief Complaint  Patient presents with   Establish Care   ADHD    Med management    HPI:  Devin Zimmerman is a 31 y.o. White male who is here to establish care f/u hx of adult ADD, mood d/o. Patient's most recent primary MD: [facility]. Old records in epic/health Link were reviewed prior to or during today's visit.  Most recent complete physical with [facility] was 08/03/2020.  Labs all normal at that visit. Most recent ADHD follow-up visit with them--virtual visit--11/08/2020.  At that time he was continued on Adderall IR/short acting 10 mg a day plus Adderall IR/short acting 30 mg a day. Patient states he was diagnosed about 3 years ago with adult ADD after seeing a psychiatrist with [facility], patient does not recall the name. He apparently has been having many years of poor concentration, impulsivity, trouble remembering things, easily frustrated with complex tasks, mood swings. When asked specifically about bipolar disorder or thought disorder he says nothing like that was diagnosed. Apparently they presented to the psychiatrist stating that he had same symptoms as his brother, who had been on Risperdal, guanfacine, and Adderall and it had been helping very well.  Therefore, the patient was started on the same medications all at the same time and they have been helping extremely well. Due to a network change for his insurer he had to find a new doctor--me. He has been rationing his Adderall for the last month or so, most recent dose yesterday. Denies any significant intake of alcohol.  No illicit drugs.  PMP AWARE reviewed today: most recent rx for adderall 30 mg was filled 01/08/21, # 30, rx by Kathlen Brunswick NP. Most recent rx for adderall 10 mg was filled 12/28/20, #30, rx by Kathlen Brunswick, NP. No red flags.   Past Medical History:  Diagnosis Date   Attention deficit disorder (ADD) in adult    Elevated  blood pressure reading    Hay fever    Hypertriglyceridemia 07/2020   Migraine syndrome    Mood disorder (HCC)     Past Surgical History:  Procedure Laterality Date   INGUINAL HERNIA REPAIR     ORIF FIBULA FRACTURE Left 11/08/2015   Procedure: OPEN REDUCTION INTERNAL FIXATION (ORIF) FIBULA FRACTURE, left;  Surgeon: Loreta Ave, MD;  Location: [facility];  Service: Orthopedics;  Laterality: Left;  Block    Family History  Problem Relation Age of Onset   Diabetes Mother    High Cholesterol Mother    High blood pressure Mother    High Cholesterol Father    Depression Sister    Arthritis Maternal Grandmother    Miscarriages / Stillbirths Maternal Grandmother    Stroke Maternal Grandfather    High blood pressure Maternal Grandfather    Heart attack Maternal Grandfather    Hearing loss Maternal Grandfather    Stroke Paternal Grandmother    Heart attack Paternal Grandmother    Stroke Paternal Grandfather    Heart attack Paternal Grandfather     Social History   Socioeconomic History   Marital status: Married    Spouse name: Not on file   Number of children: Not on file   Years of education: Not on file   Highest education level: Not on file  Occupational History   Not on file  Tobacco Use   Smoking status: Every Day    Packs/day: 0.50    Years: 5.00  Pack years: 2.50    Types: Cigarettes   Smokeless tobacco: Current  Vaping Use   Vaping Use: Every day   Start date: 02/08/2021  Substance and Sexual Activity   Alcohol use: Yes    Comment: occasionally   Drug use: No   Sexual activity: Not on file  Other Topics Concern   Not on file  Social History Narrative   Married, 2 sons.   Occupation: Scientist, product/process development and solutions in [employer]   Education: finished 11th grade   Orig from [place].  G[place] up [place].   No tobacco, occ alcohol, no hx of ETOH abuse or drug use.      Social Determinants of Health    Financial Resource Strain: Not on file  Food Insecurity: Not on file  Transportation Needs: Not on file  Physical Activity: Not on file  Stress: Not on file  Social Connections: Not on file  Intimate Partner Violence: Not on file    Outpatient Encounter Medications as of 03/08/2021  Medication Sig   fluticasone (FLONASE) 50 MCG/ACT nasal spray Place 1 spray into both nostrils daily.   SUMAtriptan (IMITREX) 50 MG tablet Take by mouth.   [DISCONTINUED] amphetamine-dextroamphetamine (ADDERALL) 10 MG tablet Take 10 mg by mouth daily.   [DISCONTINUED] amphetamine-dextroamphetamine (ADDERALL) 30 MG tablet Take 1 tablet by mouth daily.   [DISCONTINUED] guanFACINE (TENEX) 1 MG tablet Take 1 mg by mouth daily.   [DISCONTINUED] risperiDONE (RISPERDAL) 3 MG tablet Take by mouth.   amphetamine-dextroamphetamine (ADDERALL) 10 MG tablet Take 1 tablet (10 mg total) by mouth daily.   amphetamine-dextroamphetamine (ADDERALL) 30 MG tablet Take 1 tablet by mouth daily.   guanFACINE (TENEX) 1 MG tablet Take 1 tablet (1 mg total) by mouth daily.   risperiDONE (RISPERDAL) 3 MG tablet Take 1 tablet (3 mg total) by mouth daily.   [DISCONTINUED] amphetamine-dextroamphetamine (ADDERALL) 10 MG tablet Take 1 tablet (10 mg total) by mouth daily.   [DISCONTINUED] amphetamine-dextroamphetamine (ADDERALL) 10 MG tablet Take 1 tablet (10 mg total) by mouth daily.   [DISCONTINUED] amphetamine-dextroamphetamine (ADDERALL) 30 MG tablet Take 1 tablet by mouth daily.   [DISCONTINUED] amphetamine-dextroamphetamine (ADDERALL) 30 MG tablet Take 1 tablet by mouth daily.   [DISCONTINUED] doxycycline (VIBRAMYCIN) 100 MG capsule Take 1 capsule (100 mg total) by mouth 2 (two) times daily. One po bid x 7 days (Patient not taking: Reported on 03/08/2021)   [DISCONTINUED] HYDROcodone-acetaminophen (NORCO) 5-325 MG tablet Take 1-2 tablets by mouth every 6 (six) hours as needed. (Patient not taking: Reported on 03/08/2021)   [DISCONTINUED]  ibuprofen (ADVIL) 800 MG tablet Take 1 tablet (800 mg total) by mouth 3 (three) times daily. (Patient not taking: Reported on 03/08/2021)   No facility-administered encounter medications on file as of 03/08/2021.    Allergies  Allergen Reactions   Ceclor [Cefaclor] Other (See Comments)    UNKNOWN   ROS as above, plus--> no fevers, no CP, no SOB, no wheezing, no cough, no dizziness, no HAs, no rashes, no melena/hematochezia.  No polyuria or polydipsia.  No myalgias or arthralgias.  No focal weakness, paresthesias, or tremors.  No acute vision or hearing abnormalities.  No dysuria or unusual/new urinary urgency or frequency.  No recent changes in lower legs. No n/v/d or abd pain.  No palpitations.    PE; Blood pressure 124/83, pulse 78, temperature 98.1 F (36.7 C), temperature source Oral, height 5' 10.5" (1.791 m), weight 179 lb (81.2 kg), SpO2 99 %.Body mass index is 25.32  kg/m.  Physical Exam  Wt Readings from Last 2 Encounters:  03/08/21 179 lb (81.2 kg)  10/24/19 170 lb (77.1 kg)    Gen: alert, oriented x 4, affect pleasant.  Lucid thinking and conversation noted. HEENT: PERRLA, EOMI.   Neck: no LAD, mass, or thyromegaly. CV: RRR, no m/r/g LUNGS: CTA bilat, nonlabored. NEURO: no tremor or tics noted on observation.  Coordination intact. CN 2-12 grossly intact bilaterally, strength 5/5 in all extremeties.  No ataxia.  Pertinent labs:  Last CBC Lab Results  Component Value Date   WBC 9.5 08/03/2020   HGB 15.8 08/03/2020   HCT 46 08/03/2020   MCV 87.1 10/25/2019   MCH 28.9 10/25/2019   RDW 12.8 10/25/2019   PLT 265 08/03/2020   Last metabolic panel Lab Results  Component Value Date   GLUCOSE 103 (H) 10/25/2019   NA 144 08/03/2020   K 4.2 08/03/2020   CL 103 08/03/2020   CO2 21 08/03/2020   BUN 12 08/03/2020   CREATININE 0.9 08/03/2020   GFRNONAA 114 08/03/2020   CALCIUM 9.7 08/03/2020   PROT 8.2 05/16/2009   ALBUMIN 4.7 08/03/2020   BILITOT 0.9 05/16/2009    ALKPHOS 83 08/03/2020   AST 23 08/03/2020   ALT 18 08/03/2020   ANIONGAP 10 10/25/2019   Last lipids Lab Results  Component Value Date   CHOL 164 08/03/2020   HDL 38 08/03/2020   LDLCALC 66 08/03/2020   TRIG 385 (A) 08/03/2020   Last thyroid functions Lab Results  Component Value Date   TSH 0.99 08/03/2020   ASSESSMENT AND PLAN:   New patient, establishing care.  1.  Adult ADD. Question of bipolar affective disorder. He has been doing well from a symptom standpoint ever since being started approximately 3 years ago on Risperdal 3 mg a day, guanfacine 1 mg a day, and Adderall 30 mg +10 mg a day. We will continue these medications. Controlled substance contract reviewed with patient today.  Patient signed this and it will be placed in the chart.   Urine drug screen today. I did electronic rx's for adderall 30mg  and adderall 10mg  today for each of the next 3 months.  Appropriate fill on/after date was noted on each rx.  #2 history of elevated blood pressure reading. Patient states he did monitor blood pressure for a while at home after having a high blood pressure and a clinic visit--- are were normal.  3.  Hypertriglyceridemia: Triglycerides 385 in July 2022. Plan recheck approximately July 2023.  An After Visit Summary was printed and given to the patient.  Return in about 3 months (around 06/05/2021) for f/u adult add.  Signed:  August 2023, MD           03/08/2021

## 2021-03-08 ENCOUNTER — Encounter: Payer: Self-pay | Admitting: Family Medicine

## 2021-03-08 ENCOUNTER — Ambulatory Visit (INDEPENDENT_AMBULATORY_CARE_PROVIDER_SITE_OTHER): Payer: Self-pay | Admitting: Family Medicine

## 2021-03-08 ENCOUNTER — Other Ambulatory Visit: Payer: Self-pay | Admitting: Family Medicine

## 2021-03-08 ENCOUNTER — Other Ambulatory Visit: Payer: Self-pay

## 2021-03-08 VITALS — BP 124/83 | HR 78 | Temp 98.1°F | Ht 70.5 in | Wt 179.0 lb

## 2021-03-08 DIAGNOSIS — F909 Attention-deficit hyperactivity disorder, unspecified type: Secondary | ICD-10-CM

## 2021-03-08 DIAGNOSIS — R030 Elevated blood-pressure reading, without diagnosis of hypertension: Secondary | ICD-10-CM

## 2021-03-08 DIAGNOSIS — E781 Pure hyperglyceridemia: Secondary | ICD-10-CM

## 2021-03-08 DIAGNOSIS — Z79899 Other long term (current) drug therapy: Secondary | ICD-10-CM

## 2021-03-08 DIAGNOSIS — F39 Unspecified mood [affective] disorder: Secondary | ICD-10-CM

## 2021-03-08 MED ORDER — AMPHETAMINE-DEXTROAMPHETAMINE 30 MG PO TABS: 1.0000 | ORAL_TABLET | Freq: Every day | ORAL | 0 refills | Status: AC

## 2021-03-08 MED ORDER — AMPHETAMINE-DEXTROAMPHETAMINE 10 MG PO TABS: 10.0000 mg | ORAL_TABLET | Freq: Every day | ORAL | 0 refills | Status: DC

## 2021-03-08 MED ORDER — AMPHETAMINE-DEXTROAMPHETAMINE 10 MG PO TABS
10.0000 mg | ORAL_TABLET | Freq: Every day | ORAL | 0 refills | Status: DC
Start: 2021-03-08 — End: 2022-11-12

## 2021-03-08 MED ORDER — RISPERIDONE 3 MG PO TABS: 3.0000 mg | ORAL_TABLET | Freq: Every day | ORAL | 1 refills | Status: AC

## 2021-03-08 MED ORDER — GUANFACINE HCL 1 MG PO TABS: 1.0000 mg | ORAL_TABLET | Freq: Every day | ORAL | 1 refills | Status: AC

## 2021-03-08 MED ORDER — AMPHETAMINE-DEXTROAMPHETAMINE 30 MG PO TABS: 1.0000 | ORAL_TABLET | Freq: Every day | ORAL | 0 refills | Status: DC

## 2021-03-08 NOTE — Telephone Encounter (Signed)
Pt advised refill sent. °

## 2021-03-08 NOTE — Telephone Encounter (Signed)
Pt had est care appt today, CSC and UDS completed.   Please resend to [facility]. Meds pending

## 2021-03-08 NOTE — Telephone Encounter (Signed)
[facility] [place] [facility] only place that has meds in stock.   amphetamine-dextroamphetamine amphetamine-dextroamphetamine (ADDERALL) 30 MG tablet   amphetamine-dextroamphetamine amphetamine-dextroamphetamine (ADDERALL) 10 MG tablet  [id number] - [place], [place] - [address] Phone:  [phone]  Fax:  [phone]

## 2021-03-11 LAB — DRUG MONITORING PANEL 376104, URINE
Amphetamine: 895 ng/mL — ABNORMAL HIGH (ref <250)
Amphetamines: POSITIVE ng/mL — AB (ref <500)
Barbiturates: NEGATIVE ng/mL (ref <300)
Benzodiazepines: NEGATIVE ng/mL (ref <100)
Cocaine Metabolite: NEGATIVE ng/mL (ref <150)
Desmethyltramadol: NEGATIVE ng/mL (ref <100)
Methamphetamine: 780 ng/mL — ABNORMAL HIGH (ref <250)
Opiates: NEGATIVE ng/mL (ref <100)
Oxycodone: NEGATIVE ng/mL (ref <100)
Tramadol: NEGATIVE ng/mL (ref <100)

## 2021-03-11 LAB — DM TEMPLATE

## 2021-06-07 ENCOUNTER — Ambulatory Visit: Payer: Self-pay | Admitting: Family Medicine

## 2021-06-18 ENCOUNTER — Encounter: Payer: Self-pay | Admitting: Family Medicine

## 2021-06-19 ENCOUNTER — Encounter (HOSPITAL_BASED_OUTPATIENT_CLINIC_OR_DEPARTMENT_OTHER): Payer: Self-pay | Admitting: Emergency Medicine

## 2021-06-19 ENCOUNTER — Emergency Department (HOSPITAL_BASED_OUTPATIENT_CLINIC_OR_DEPARTMENT_OTHER)
Admission: EM | Admit: 2021-06-19 | Discharge: 2021-06-19 | Disposition: A | Discharge status: Home / Self Care | Payer: Self-pay | Attending: Emergency Medicine | Admitting: Emergency Medicine

## 2021-06-19 ENCOUNTER — Other Ambulatory Visit: Payer: Self-pay

## 2021-06-19 DIAGNOSIS — T7840XA Allergy, unspecified, initial encounter: Secondary | ICD-10-CM | POA: Insufficient documentation

## 2021-06-19 DIAGNOSIS — F17210 Nicotine dependence, cigarettes, uncomplicated: Secondary | ICD-10-CM | POA: Insufficient documentation

## 2021-06-19 DIAGNOSIS — R030 Elevated blood-pressure reading, without diagnosis of hypertension: Secondary | ICD-10-CM | POA: Insufficient documentation

## 2021-06-19 DIAGNOSIS — Z79899 Other long term (current) drug therapy: Secondary | ICD-10-CM | POA: Insufficient documentation

## 2021-06-19 MED ORDER — DEXAMETHASONE SODIUM PHOSPHATE 10 MG/ML IJ SOLN
10.0000 mg | Freq: Once | INTRAMUSCULAR | Status: AC
Start: 2021-06-19 — End: 2021-06-19
  Administered 2021-06-19: 10 mg via INTRAVENOUS
  Filled 2021-06-19: qty 1

## 2021-06-19 MED ORDER — DIPHENHYDRAMINE HCL 50 MG/ML IJ SOLN
50.0000 mg | Freq: Once | INTRAMUSCULAR | Status: AC
  Administered 2021-06-19: 50 mg via INTRAVENOUS
  Filled 2021-06-19: qty 1

## 2021-06-19 MED ORDER — FAMOTIDINE IN NACL 20-0.9 MG/50ML-% IV SOLN
20.0000 mg | Freq: Once | INTRAVENOUS | Status: AC
  Administered 2021-06-19: 20 mg via INTRAVENOUS
  Filled 2021-06-19: qty 50

## 2021-06-19 NOTE — ED Triage Notes (Signed)
Pt reports chest pain, shob, and rash that started ~1 hour pta. Rash present on back and abdomen. Pt took benadry ~ 30 minutes pta. Denies oral swelling. NAD. Airway patent.

## 2021-06-19 NOTE — ED Provider Notes (Signed)
[facility] Provider Note: Georgena Spurling, MD, FACEP  CSN: [id number] MRN: [id number] ARRIVAL: 06/19/21 at [place]: Seen in triage room   CHIEF COMPLAINT  Allergic Reaction   HISTORY OF PRESENT ILLNESS  06/19/21 1:00 AM Devin Zimmerman is a 31 y.o. male who developed a generalized urticarial rash and chest discomfort about 1 hour prior to arrival.  He had just eaten chocolate.  He has no history of chocolate allergy.  He denies associated throat swelling, shortness of breath, nausea, vomiting or diarrhea.   Past Medical History:  Diagnosis Date   Attention deficit disorder (ADD) in adult    Elevated blood pressure reading    Hay fever    Hypertriglyceridemia 07/2020   Migraine syndrome    Mood disorder ([place])     Past Surgical History:  Procedure Laterality Date   INGUINAL HERNIA REPAIR     ORIF FIBULA FRACTURE Left 11/08/2015   Procedure: OPEN REDUCTION INTERNAL FIXATION (ORIF) FIBULA FRACTURE, left;  Surgeon: Ninetta Lights, MD;  Location: [facility];  Service: Orthopedics;  Laterality: Left;  Block    Family History  Problem Relation Age of Onset   Diabetes Mother    High Cholesterol Mother    High blood pressure Mother    High Cholesterol Father    Depression Sister    Arthritis Maternal Grandmother    Miscarriages / Stillbirths Maternal Grandmother    Stroke Maternal Grandfather    High blood pressure Maternal Grandfather    Heart attack Maternal Grandfather    Hearing loss Maternal Grandfather    Stroke Paternal Grandmother    Heart attack Paternal Grandmother    Stroke Paternal Grandfather    Heart attack Paternal Grandfather     Social History   Tobacco Use   Smoking status: Every Day    Packs/day: 0.50    Years: 5.00    Pack years: 2.50    Types: Cigarettes   Smokeless tobacco: Current  Vaping Use   Vaping Use: Every day   Start date: 02/08/2021  Substance Use Topics   Alcohol use: Yes    Comment: occasionally    Drug use: No    Prior to Admission medications   Medication Sig Start Date End Date Taking? Authorizing Provider  amphetamine-dextroamphetamine (ADDERALL) 10 MG tablet Take 1 tablet (10 mg total) by mouth daily. 03/08/21   McGowen, Adrian Blackwater, MD  amphetamine-dextroamphetamine (ADDERALL) 30 MG tablet Take 1 tablet by mouth daily. 03/08/21   McGowen, Adrian Blackwater, MD  fluticasone (FLONASE) 50 MCG/ACT nasal spray Place 1 spray into both nostrils daily. 10/30/18   Volanda Napoleon, PA-C  guanFACINE (TENEX) 1 MG tablet Take 1 tablet (1 mg total) by mouth daily. 03/08/21   McGowen, Adrian Blackwater, MD  risperiDONE (RISPERDAL) 3 MG tablet Take 1 tablet (3 mg total) by mouth daily. 03/08/21   McGowen, Adrian Blackwater, MD  SUMAtriptan (IMITREX) 50 MG tablet Take by mouth. 05/11/20   [provider]    Allergies Ceclor [cefaclor]   REVIEW OF SYSTEMS  Negative except as noted here or in the History of Present Illness.   PHYSICAL EXAMINATION  Initial Vital Signs Blood pressure (!) 146/102, pulse 87, temperature 98.1 F (36.7 C), temperature source Oral, resp. rate 18, SpO2 100 %.  Examination General: Well-developed, well-nourished male in no acute distress; appearance consistent with age of record HENT: normocephalic; atraumatic; no pharyngeal edema; no dysphonia Eyes: pupils equal, round and reactive to light; extraocular muscles intact  Neck: supple Heart: regular rate and rhythm Lungs: clear to auscultation bilaterally Abdomen: soft; nondistended; nontender; bowel sounds present Extremities: No deformity; full range of motion Neurologic: Awake, alert and oriented; motor function intact in all extremities and symmetric; no facial droop Skin: Warm and dry; generalized urticarial rash Psychiatric: Normal mood and affect   RESULTS  Summary of this visit's results, reviewed and interpreted by myself:   EKG Interpretation  Date/Time:  Wednesday June 19 2021 00:49:48 EDT Ventricular Rate:  81 PR  Interval:  154 QRS Duration: 96 QT Interval:  356 QTC Calculation: 413 R Axis:   124 Text Interpretation: Normal sinus rhythm Right axis deviation Abnormal ECG No previous ECGs available Confirmed by Carleah Yablonski [phone]) on 06/19/2021 1:04:27 AM       Laboratory Studies: No results found for this or any previous visit (from the past 24 hour(s)). Imaging Studies: No results found.  ED COURSE and MDM  Nursing notes, initial and subsequent vitals signs, including pulse oximetry, reviewed and interpreted by myself.  Vitals:   06/19/21 0045  BP: (!) 146/102  Pulse: 87  Resp: 18  Temp: 98.1 F (36.7 C)  TempSrc: Oral  SpO2: 100%   Medications  diphenhydrAMINE (BENADRYL) injection 50 mg (50 mg Intravenous Given 06/19/21 0103)  dexamethasone (DECADRON) injection 10 mg (10 mg Intravenous Given 06/19/21 0106)  famotidine (PEPCID) IVPB 20 mg premix (0 mg Intravenous Stopped 06/19/21 0138)   2:23 AM Patient feels better and urticaria is resolving.  This is not consistent with an anaphylactic reaction as he did not have hypotension or difficulty breathing.  The cause of the allergic reaction is unclear as he has never had a reaction to chocolate in the past.  He said there were no nuts in the chocolate.  He was advised to eat chocolate with great care in the future and to follow-up with his PCP.  He has Benadryl at home and he was advised to continue taking 50 mg of Benadryl every 6 hours until symptoms resolve.   PROCEDURES  Procedures   ED DIAGNOSES     ICD-10-CM   1. Allergic reaction, initial encounter  T78.40XA          Remberto Lienhard, Jenny Reichmann, MD 06/19/21 [phone]

## 2022-11-12 ENCOUNTER — Emergency Department (HOSPITAL_BASED_OUTPATIENT_CLINIC_OR_DEPARTMENT_OTHER): Payer: Self-pay

## 2022-11-12 ENCOUNTER — Emergency Department (HOSPITAL_BASED_OUTPATIENT_CLINIC_OR_DEPARTMENT_OTHER): Payer: BC Managed Care – PPO

## 2022-11-12 ENCOUNTER — Emergency Department (HOSPITAL_BASED_OUTPATIENT_CLINIC_OR_DEPARTMENT_OTHER)
Admission: EM | Admit: 2022-11-12 | Discharge: 2022-11-12 | Disposition: A | Discharge status: Home / Self Care | Payer: BC Managed Care – PPO | Attending: Emergency Medicine | Admitting: Emergency Medicine

## 2022-11-12 ENCOUNTER — Encounter (HOSPITAL_BASED_OUTPATIENT_CLINIC_OR_DEPARTMENT_OTHER): Payer: Self-pay

## 2022-11-12 DIAGNOSIS — F17210 Nicotine dependence, cigarettes, uncomplicated: Secondary | ICD-10-CM | POA: Diagnosis not present

## 2022-11-12 DIAGNOSIS — R1032 Left lower quadrant pain: Secondary | ICD-10-CM

## 2022-11-12 DIAGNOSIS — R1030 Lower abdominal pain, unspecified: Secondary | ICD-10-CM | POA: Diagnosis present

## 2022-11-12 DIAGNOSIS — K4090 Unilateral inguinal hernia, without obstruction or gangrene, not specified as recurrent: Secondary | ICD-10-CM | POA: Insufficient documentation

## 2022-11-12 LAB — CBC WITH DIFFERENTIAL/PLATELET
Abs Immature Granulocytes: 0.01 10*3/uL (ref 0.00–0.07)
Basophils Absolute: 0.1 10*3/uL (ref 0.0–0.1)
Basophils Relative: 1 %
Eosinophils Absolute: 0.2 10*3/uL (ref 0.0–0.5)
Eosinophils Relative: 3 %
HCT: 46.1 % (ref 39.0–52.0)
Hemoglobin: 15.3 g/dL (ref 13.0–17.0)
Immature Granulocytes: 0 %
Lymphocytes Relative: 43 %
Lymphs Abs: 2.7 10*3/uL (ref 0.7–4.0)
MCH: 29.1 pg (ref 26.0–34.0)
MCHC: 33.2 g/dL (ref 30.0–36.0)
MCV: 87.6 fL (ref 80.0–100.0)
Monocytes Absolute: 0.6 10*3/uL (ref 0.1–1.0)
Monocytes Relative: 9 %
Neutro Abs: 2.9 10*3/uL (ref 1.7–7.7)
Neutrophils Relative %: 44 %
Platelets: 252 10*3/uL (ref 150–400)
RBC: 5.26 MIL/uL (ref 4.22–5.81)
RDW: 12.2 % (ref 11.5–15.5)
WBC: 6.4 10*3/uL (ref 4.0–10.5)
nRBC: 0 % (ref 0.0–0.2)

## 2022-11-12 LAB — BASIC METABOLIC PANEL
Anion gap: 7 (ref 5–15)
BUN: 10 mg/dL (ref 6–20)
CO2: 26 mmol/L (ref 22–32)
Calcium: 9.1 mg/dL (ref 8.9–10.3)
Chloride: 104 mmol/L (ref 98–111)
Creatinine, Ser: 1.02 mg/dL (ref 0.61–1.24)
GFR, Estimated: >60 mL/min (ref >60)
Glucose, Bld: 105 mg/dL — ABNORMAL HIGH (ref 70–99)
Potassium: 4.3 mmol/L (ref 3.5–5.1)
Sodium: 137 mmol/L (ref 135–145)

## 2022-11-12 MED ORDER — IOHEXOL 300 MG/ML  SOLN
100.0000 mL | Freq: Once | INTRAMUSCULAR | Status: AC | PRN
  Administered 2022-11-12: 100 mL via INTRAVENOUS

## 2022-11-12 NOTE — ED Triage Notes (Signed)
C/o pain to left abdomen radiating into testicles with left testicular swelling. Denies trouble urinating/discharge.

## 2022-11-12 NOTE — Discharge Instructions (Signed)
We evaluated you for your groin pain.  Your ultrasound and CT scan were normal.  I think your symptoms are still most likely due to a small hernia.  I would recommend following up with a general surgeon.  He can follow-up with [facility].  Please take Tylenol and Motrin for your symptoms at home.  You can take 1000 mg of Tylenol every 6 hours and 600 mg of ibuprofen every 6 hours as needed for your symptoms.  You can take these medicines together as needed, either at the same time, or alternating every 3 hours.   If your symptoms recur or you have any severe pain, vomiting, abdominal pain, difficulty breathing, or any other new symptoms, please return to the emergency department.

## 2022-11-12 NOTE — ED Provider Notes (Signed)
[facility] AT [facility] Provider Note  CSN: [id number] Arrival date & time: 11/12/22 0840  Chief Complaint(s) Testicle Pain  HPI Devin Zimmerman is a 32 y.o. male with history of right inguinal hernia presenting to the emergency department with left groin pain.  Reports it began yesterday.  Felt swelling to his left inguinal region.  Denies any specific testicular pain or dysuria.  Reports it feels like when he had a hernia on the right.  No nausea or vomiting.  Having normal bowel movements.  No constipation.  No fevers or chills.   Past Medical History Past Medical History:  Diagnosis Date   Attention deficit disorder (ADD) in adult    Elevated blood pressure reading    Hay fever    Hypertriglyceridemia 07/2020   Migraine syndrome    Mood disorder [facility])    Patient Active Problem List   Diagnosis Date Noted   Migraine with aura and without status migrainosus, not intractable 05/11/2020   Attention deficit hyperactivity disorder (ADHD), predominantly inattentive type 07/17/2017   GAD (generalized anxiety disorder) 07/17/2017   Primary insomnia 07/17/2017   Right wrist injury 05/21/2015   Home Medication(s) Prior to Admission medications   Medication Sig Start Date End Date Taking? Authorizing Provider  amphetamine-dextroamphetamine (ADDERALL) 20 MG tablet Take 20 mg by mouth daily. Take with 30 mg tablet for a total dose of 50 mg daily. 10/27/22  Yes [provider]  amphetamine-dextroamphetamine (ADDERALL) 30 MG tablet Take 1 tablet by mouth daily. Patient taking differently: Take 30 mg by mouth daily. Take with 20 mg tablet for a total dose of 50 mg daily. 03/08/21  Yes McGowen, Maryjean Morn, MD  EPINEPHrine 0.3 mg/0.3 mL IJ SOAJ injection Inject into the muscle as needed for anaphylaxis. Patient not taking: Reported on 11/12/2022 07/30/22   [provider]  guanFACINE (TENEX) 1 MG tablet Take 1 tablet (1 mg total) by mouth  daily. Patient not taking: Reported on 11/12/2022 03/08/21   Jeoffrey Massed, MD  risperiDONE (RISPERDAL) 3 MG tablet Take 1 tablet (3 mg total) by mouth daily. Patient not taking: Reported on 11/12/2022 03/08/21   Jeoffrey Massed, MD                                                                                                                                    Past Surgical History Past Surgical History:  Procedure Laterality Date   INGUINAL HERNIA REPAIR     ORIF FIBULA FRACTURE Left 11/08/2015   Procedure: OPEN REDUCTION INTERNAL FIXATION (ORIF) FIBULA FRACTURE, left;  Surgeon: Loreta Ave, MD;  Location: [facility];  Service: Orthopedics;  Laterality: Left;  Block   Family History Family History  Problem Relation Age of Onset   Diabetes Mother    High Cholesterol Mother    High blood pressure Mother    High Cholesterol Father  Depression Sister    Arthritis Maternal Grandmother    Miscarriages / Stillbirths Maternal Grandmother    Stroke Maternal Grandfather    High blood pressure Maternal Grandfather    Heart attack Maternal Grandfather    Hearing loss Maternal Grandfather    Stroke Paternal Grandmother    Heart attack Paternal Grandmother    Stroke Paternal Grandfather    Heart attack Paternal Grandfather     Social History Social History   Tobacco Use   Smoking status: Every Day    Current packs/day: 0.50    Average packs/day: 0.5 packs/day for 5.0 years (2.5 ttl pk-yrs)    Types: Cigarettes   Smokeless tobacco: Current  Vaping Use   Vaping status: Every Day   Start date: 02/08/2021  Substance Use Topics   Alcohol use: Not Currently   Drug use: No   Allergies Beef allergy and Ceclor [cefaclor]  Review of Systems Review of Systems  All other systems reviewed and are negative.   Physical Exam Vital Signs  I have reviewed the triage vital signs BP 118/80 (BP Location: Right Arm)   Pulse 67   Temp 97.7 F (36.5 C) (Oral)   Resp  16   Ht 5\' 11"  (1.803 m)   Wt 79.4 kg   SpO2 99%   BMI 24.41 kg/m  Physical Exam Vitals and nursing note reviewed.  Constitutional:      General: He is not in acute distress.    Appearance: Normal appearance.  HENT:     Mouth/Throat:     Mouth: Mucous membranes are moist.  Eyes:     Conjunctiva/sclera: Conjunctivae normal.  Cardiovascular:     Rate and Rhythm: Normal rate and regular rhythm.  Pulmonary:     Effort: Pulmonary effort is normal. No respiratory distress.     Breath sounds: Normal breath sounds.  Abdominal:     General: Abdomen is flat.     Palpations: Abdomen is soft.     Tenderness: There is no abdominal tenderness.  Genitourinary:    Comments: Chaperoned by RN, left inguinal swelling/hernia.  No testicular pain or tenderness, swelling..  Normal external genitalia. Musculoskeletal:     Right lower leg: No edema.     Left lower leg: No edema.  Skin:    General: Skin is warm and dry.     Capillary Refill: Capillary refill takes less than 2 seconds.  Neurological:     Mental Status: He is alert and oriented to person, place, and time. Mental status is at baseline.  Psychiatric:        Mood and Affect: Mood normal.        Behavior: Behavior normal.     ED Results and Treatments Labs (all labs ordered are listed, but only abnormal results are displayed) Labs Reviewed  BASIC METABOLIC PANEL - Abnormal; Notable for the following components:      Result Value   Glucose, Bld 105 (*)    All other components within normal limits  CBC WITH DIFFERENTIAL/PLATELET  Radiology US SCROTUM W/DOPPLER  Result Date: 11/12/2022 CLINICAL DATA:  144846 Testicle pain 144846. EXAM: SCROTAL ULTRASOUND DOPPLER ULTRASOUND OF THE TESTICLES TECHNIQUE: Complete ultrasound examination of the testicles, epididymis, and other scrotal structures was performed. Color  and spectral Doppler ultrasound were also utilized to evaluate blood flow to the testicles. COMPARISON:  None Available. FINDINGS: Right testicle Measurements: 1.7 x 2.7 x 4.4 cm. No mass or microlithiasis visualized. However, note is made of heterogeneous echotexture which is nonspecific but may represent sequela of prior infection or inflammation. Left testicle Measurements: 2.3 x 3.5 x 5.3 cm. No mass or microlithiasis visualized. However, note is made of heterogeneous echotexture which is nonspecific but may represent sequela of prior infection or inflammation. Right epididymis:  Normal in size and appearance. Left epididymis:  Normal in size and appearance. Hydrocele:  None visualized. Varicocele:  None visualized. Pulsed Doppler interrogation of both testes demonstrates normal low resistance arterial and venous waveforms bilaterally. IMPRESSION: *Note is made of heterogeneous echotexture of bilateral testicles without increased vascularity or focal lesion, which is nonspecific but may represent sequela of prior infection or inflammation. *Otherwise unremarkable exam. Electronically Signed   By: Jules Schick M.D.   On: 11/12/2022 13:16   CT ABDOMEN PELVIS W CONTRAST  Result Date: 11/12/2022 CLINICAL DATA:  Left lower quadrant abdominal pain.  Hernia. EXAM: CT ABDOMEN AND PELVIS WITH CONTRAST TECHNIQUE: Multidetector CT imaging of the abdomen and pelvis was performed using the standard protocol following bolus administration of intravenous contrast. RADIATION DOSE REDUCTION: This exam was performed according to the departmental dose-optimization program which includes automated exposure control, adjustment of the mA and/or kV according to patient size and/or use of iterative reconstruction technique. CONTRAST:  OMNIPAQUE IOHEXOL 300 MG/ML  SOLN COMPARISON:  CT scan abdomen and pelvis from 05/16/2009. FINDINGS: Lower chest: There are patchy atelectatic changes in the visualized right lung bases.  Bilateral lungs are otherwise clear. No overt consolidation. No pleural effusion. The heart is normal in size. No pericardial effusion. Hepatobiliary: The liver is normal in size. Non-cirrhotic configuration. No suspicious mass. These is mild diffuse hepatic steatosis. No intrahepatic or extrahepatic bile duct dilation. No calcified gallstones. Normal gallbladder wall thickness. No pericholecystic inflammatory changes. Pancreas: Unremarkable. No pancreatic ductal dilatation or surrounding inflammatory changes. Spleen: Within normal limits. No focal lesion. Adrenals/Urinary Tract: Adrenal glands are unremarkable. No suspicious renal mass. No hydronephrosis. No renal or ureteric calculi. Unremarkable urinary bladder. Stomach/Bowel: No disproportionate dilation of the small or large bowel loops. No evidence of abnormal bowel wall thickening or inflammatory changes. The appendix is unremarkable. There is moderate stool burden. There is fecalization of multiple distal ileal loops, which may be due to constipation or incompetent ileocecal valve. Vascular/Lymphatic: No ascites or pneumoperitoneum. No abdominal or pelvic lymphadenopathy, by size criteria. No aneurysmal dilation of the major abdominal arteries. Reproductive: Normal size prostate. Symmetric seminal vesicles. Other: There is a tiny fat containing umbilical hernia. The soft tissues and abdominal wall are otherwise unremarkable. Musculoskeletal: No suspicious osseous lesions. IMPRESSION: *No acute inflammatory process identified within the abdomen or pelvis. There is tiny fat containing umbilical hernia. No inguinal hernia or anterior abdominal wall hernia. *Other nonacute observations, as described above. Electronically Signed   By: Jules Schick M.D.   On: 11/12/2022 11:26    Pertinent labs & imaging results that were available during my care of the patient were reviewed by me and considered in my medical decision making (see MDM for  details).  Medications Ordered in ED  Medications  iohexol (OMNIPAQUE) 300 MG/ML solution 100 mL (100 mLs Intravenous Contrast Given 11/12/22 1018)                                                                                                                                     Procedures Procedures  (including critical care time)  Medical Decision Making / ED Course   MDM:  32 year old male presenting with left groin pain.  On exam, patient has swelling to his left inguinal region seems consistent with hernia.  Does seem reducible.  He does have some tenderness will obtain CT scan to confirm this.  No specific testicular pain despite listed chief complaint to suggest patient is having an acute testicular process.  He has no tenderness to his testicle, no swelling, he denies any specific testicle pain by history.  Clinical Course as of 11/12/22 1401  Wed Nov 12, 2022  1359 CT scan did not show evidence of hernia.  Given the lack of clear cause of his symptoms, ultrasound was also performed which showed normal testicles.  Low concern for intermittent torsion.  Suspect symptoms could be still related to possible very small hernia that may have reduced as he did have some firmness in that area, recommended follow-up with his primary doctor. Will discharge patient to home. All questions answered. Patient comfortable with plan of discharge. Return precautions discussed with patient and specified on the after visit summary.  [WS]    Clinical Course User Index [WS] Suezanne Jacquet, Jerilee Field, MD     Additional history obtained: -Additional history obtained from spouse -External records from outside source obtained and reviewed including: Chart review including previous notes, labs, imaging, consultation notes including prior er visits for allergic reaction    Lab Tests: -I ordered, reviewed, and interpreted labs.   The pertinent results include:   Labs Reviewed  BASIC METABOLIC PANEL -  Abnormal; Notable for the following components:      Result Value   Glucose, Bld 105 (*)    All other components within normal limits  CBC WITH DIFFERENTIAL/PLATELET    Notable for borderline hyperglycemia in non fasting patient  Imaging Studies ordered: I ordered imaging studies including CT, US scrotum On my interpretation imaging demonstrates no acute process I independently visualized and interpreted imaging. I agree with the radiologist interpretation   Medicines ordered and prescription drug management: Meds ordered this encounter  Medications   iohexol (OMNIPAQUE) 300 MG/ML solution 100 mL    -I have reviewed the patients home medicines and have made adjustments as needed   Reevaluation: After the interventions noted above, I reevaluated the patient and found that their symptoms have improved  Co morbidities that complicate the patient evaluation  Past Medical History:  Diagnosis Date   Attention deficit disorder (ADD) in adult    Elevated blood pressure reading    Hay fever    Hypertriglyceridemia 07/2020   Migraine syndrome  Mood disorder ([facility])       Dispostion: Disposition decision including need for hospitalization was considered, and patient discharged from emergency department.    Final Clinical Impression(s) / ED Diagnoses Final diagnoses:  Left inguinal pain     This chart was dictated using voice recognition software.  Despite best efforts to proofread,  errors can occur which can change the documentation meaning.    Lonell Grandchild, MD 11/12/22 [phone]
